# Patient Record
Sex: Female | Born: 1965
Health system: Southern US, Community
[De-identification: ages and names within clinical notes are randomized; demographics above are authoritative.]

## PROBLEM LIST (undated history)

## (undated) DIAGNOSIS — M545 Low back pain: Secondary | ICD-10-CM

## (undated) DIAGNOSIS — R634 Abnormal weight loss: Secondary | ICD-10-CM

## (undated) DIAGNOSIS — F419 Anxiety disorder, unspecified: Secondary | ICD-10-CM

## (undated) DIAGNOSIS — F329 Major depressive disorder, single episode, unspecified: Secondary | ICD-10-CM

## (undated) DIAGNOSIS — J069 Acute upper respiratory infection, unspecified: Secondary | ICD-10-CM

## (undated) DIAGNOSIS — K599 Functional intestinal disorder, unspecified: Secondary | ICD-10-CM

## (undated) DIAGNOSIS — J019 Acute sinusitis, unspecified: Secondary | ICD-10-CM

## (undated) DIAGNOSIS — F411 Generalized anxiety disorder: Secondary | ICD-10-CM

## (undated) DIAGNOSIS — R197 Diarrhea, unspecified: Secondary | ICD-10-CM

## (undated) DIAGNOSIS — R05 Cough: Secondary | ICD-10-CM

## (undated) DIAGNOSIS — F32A Depression, unspecified: Secondary | ICD-10-CM

## (undated) DIAGNOSIS — Z87898 Personal history of other specified conditions: Secondary | ICD-10-CM

## (undated) DIAGNOSIS — J45909 Unspecified asthma, uncomplicated: Secondary | ICD-10-CM

## (undated) DIAGNOSIS — R109 Unspecified abdominal pain: Secondary | ICD-10-CM

## (undated) DIAGNOSIS — K589 Irritable bowel syndrome without diarrhea: Secondary | ICD-10-CM

## (undated) DIAGNOSIS — R079 Chest pain, unspecified: Secondary | ICD-10-CM

## (undated) DIAGNOSIS — K219 Gastro-esophageal reflux disease without esophagitis: Secondary | ICD-10-CM

## (undated) HISTORY — DX: Low back pain: M54.5

## (undated) HISTORY — DX: Unspecified asthma, uncomplicated: J45.909

## (undated) HISTORY — DX: Functional intestinal disorder, unspecified: K59.9

## (undated) HISTORY — DX: Anxiety disorder, unspecified: F41.9

## (undated) HISTORY — DX: Irritable bowel syndrome without diarrhea: K58.9

## (undated) HISTORY — DX: Unspecified abdominal pain: R10.9

## (undated) HISTORY — DX: Generalized anxiety disorder: F41.1

## (undated) HISTORY — DX: Chest pain, unspecified: R07.9

## (undated) HISTORY — DX: Acute sinusitis, unspecified: J01.90

## (undated) HISTORY — DX: Diarrhea, unspecified: R19.7

## (undated) HISTORY — DX: Cough: R05

## (undated) HISTORY — DX: Acute upper respiratory infection, unspecified: J06.9

## (undated) HISTORY — PX: OTHER SURGICAL HISTORY: SHX169

## (undated) HISTORY — DX: Gastro-esophageal reflux disease without esophagitis: K21.9

## (undated) HISTORY — PX: NO PAST SURGERIES: SHX2092

## (undated) HISTORY — DX: Depression, unspecified: F32.A

## (undated) HISTORY — DX: Major depressive disorder, single episode, unspecified: F32.9

## (undated) HISTORY — DX: Personal history of other specified conditions: Z87.898

## (undated) HISTORY — DX: Abnormal weight loss: R63.4

---
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
## 1999-03-30 ENCOUNTER — Other Ambulatory Visit: Admission: RE | Admit: 1999-03-30 | Discharge: 1999-03-30 | Payer: Self-pay | Admitting: Gynecology

## 2000-07-11 ENCOUNTER — Encounter: Payer: Self-pay | Admitting: Emergency Medicine

## 2000-07-11 ENCOUNTER — Emergency Department (HOSPITAL_COMMUNITY): Admission: EM | Admit: 2000-07-11 | Discharge: 2000-07-11 | Payer: Self-pay | Admitting: Emergency Medicine

## 2000-12-18 ENCOUNTER — Other Ambulatory Visit: Admission: RE | Admit: 2000-12-18 | Discharge: 2000-12-18 | Payer: Self-pay | Admitting: Gynecology

## 2002-02-20 ENCOUNTER — Other Ambulatory Visit: Admission: RE | Admit: 2002-02-20 | Discharge: 2002-02-20 | Payer: Self-pay | Admitting: Gynecology

## 2003-03-16 ENCOUNTER — Other Ambulatory Visit: Admission: RE | Admit: 2003-03-16 | Discharge: 2003-03-16 | Payer: Self-pay | Admitting: Gynecology

## 2003-04-27 ENCOUNTER — Encounter: Admission: RE | Admit: 2003-04-27 | Discharge: 2003-04-27 | Payer: Self-pay | Admitting: Internal Medicine

## 2003-11-17 ENCOUNTER — Encounter: Admission: RE | Admit: 2003-11-17 | Discharge: 2003-11-17 | Payer: Self-pay | Admitting: Internal Medicine

## 2005-05-29 ENCOUNTER — Ambulatory Visit: Payer: Self-pay | Admitting: Internal Medicine

## 2006-09-24 ENCOUNTER — Ambulatory Visit: Payer: Self-pay | Admitting: Internal Medicine

## 2006-09-24 LAB — CONVERTED CEMR LAB
Albumin: 4.1 g/dL (ref 3.5–5.2)
BUN: 16 mg/dL (ref 6–23)
Basophils Relative: 5.5 % — ABNORMAL HIGH (ref 0.0–1.0)
CO2: 25 meq/L (ref 19–32)
Calcium: 9.4 mg/dL (ref 8.4–10.5)
Creatinine, Ser: 0.7 mg/dL (ref 0.4–1.2)
Eosinophils Relative: 4.7 % (ref 0.0–5.0)
GFR calc Af Amer: 120 mL/min
GFR calc non Af Amer: 100 mL/min
Glucose, Bld: 109 mg/dL — ABNORMAL HIGH (ref 70–99)
HCT: 41.9 % (ref 36.0–46.0)
Hemoglobin: 14.3 g/dL (ref 12.0–15.0)
MCHC: 34.2 g/dL (ref 30.0–36.0)
Monocytes Relative: 7.7 % (ref 3.0–11.0)
Neutrophils Relative %: 55.2 % (ref 43.0–77.0)
Potassium: 4.1 meq/L (ref 3.5–5.1)
Sodium: 139 meq/L (ref 135–145)
Total Bilirubin: 0.5 mg/dL (ref 0.3–1.2)
VLDL: 8 mg/dL (ref 0–40)
WBC: 7.7 10*3/uL (ref 4.5–10.5)

## 2006-10-02 ENCOUNTER — Ambulatory Visit: Payer: Self-pay | Admitting: Internal Medicine

## 2007-01-04 ENCOUNTER — Ambulatory Visit: Payer: Self-pay | Admitting: Internal Medicine

## 2007-01-04 LAB — CONVERTED CEMR LAB
Bilirubin Urine: NEGATIVE
Eosinophils Absolute: 0.4 10*3/uL (ref 0.0–0.6)
HCT: 39.6 % (ref 36.0–46.0)
Hemoglobin, Urine: NEGATIVE
Hemoglobin: 13.7 g/dL (ref 12.0–15.0)
Leukocytes, UA: NEGATIVE
MCHC: 34.6 g/dL (ref 30.0–36.0)
MCV: 96.3 fL (ref 78.0–100.0)
Monocytes Absolute: 1 10*3/uL — ABNORMAL HIGH (ref 0.2–0.7)
Monocytes Relative: 12.6 % — ABNORMAL HIGH (ref 3.0–11.0)
Neutro Abs: 3.9 10*3/uL (ref 1.4–7.7)
Platelets: 264 10*3/uL (ref 150–400)
RDW: 12.3 % (ref 11.5–14.6)
Specific Gravity, Urine: 1.025 (ref 1.000–1.03)
Urine Glucose: NEGATIVE mg/dL

## 2007-01-09 ENCOUNTER — Ambulatory Visit: Payer: Self-pay | Admitting: Internal Medicine

## 2007-01-09 DIAGNOSIS — J45909 Unspecified asthma, uncomplicated: Secondary | ICD-10-CM | POA: Insufficient documentation

## 2007-01-09 DIAGNOSIS — Z8669 Personal history of other diseases of the nervous system and sense organs: Secondary | ICD-10-CM

## 2007-01-09 DIAGNOSIS — Z87898 Personal history of other specified conditions: Secondary | ICD-10-CM

## 2007-01-09 HISTORY — DX: Personal history of other diseases of the nervous system and sense organs: Z86.69

## 2007-01-09 HISTORY — DX: Unspecified asthma, uncomplicated: J45.909

## 2007-01-09 HISTORY — DX: Personal history of other specified conditions: Z87.898

## 2007-03-04 ENCOUNTER — Ambulatory Visit: Payer: Self-pay | Admitting: Internal Medicine

## 2007-03-04 DIAGNOSIS — R634 Abnormal weight loss: Secondary | ICD-10-CM

## 2007-03-04 DIAGNOSIS — K599 Functional intestinal disorder, unspecified: Secondary | ICD-10-CM

## 2007-03-04 HISTORY — DX: Abnormal weight loss: R63.4

## 2007-03-04 HISTORY — DX: Functional intestinal disorder, unspecified: K59.9

## 2007-03-10 DIAGNOSIS — F411 Generalized anxiety disorder: Secondary | ICD-10-CM

## 2007-03-10 DIAGNOSIS — K219 Gastro-esophageal reflux disease without esophagitis: Secondary | ICD-10-CM

## 2007-03-10 HISTORY — DX: Gastro-esophageal reflux disease without esophagitis: K21.9

## 2007-03-10 HISTORY — DX: Generalized anxiety disorder: F41.1

## 2007-03-14 ENCOUNTER — Encounter: Payer: Self-pay | Admitting: Internal Medicine

## 2007-04-11 ENCOUNTER — Telehealth: Payer: Self-pay | Admitting: Internal Medicine

## 2007-05-07 ENCOUNTER — Telehealth: Payer: Self-pay | Admitting: Internal Medicine

## 2007-05-20 ENCOUNTER — Telehealth: Payer: Self-pay | Admitting: Internal Medicine

## 2007-06-05 ENCOUNTER — Telehealth (INDEPENDENT_AMBULATORY_CARE_PROVIDER_SITE_OTHER): Payer: Self-pay | Admitting: *Deleted

## 2007-06-11 ENCOUNTER — Ambulatory Visit: Payer: Self-pay | Admitting: Internal Medicine

## 2007-06-11 DIAGNOSIS — K589 Irritable bowel syndrome without diarrhea: Secondary | ICD-10-CM

## 2007-06-11 DIAGNOSIS — M545 Low back pain, unspecified: Secondary | ICD-10-CM

## 2007-06-11 HISTORY — DX: Irritable bowel syndrome, unspecified: K58.9

## 2007-06-11 HISTORY — DX: Low back pain, unspecified: M54.50

## 2007-07-02 ENCOUNTER — Telehealth (INDEPENDENT_AMBULATORY_CARE_PROVIDER_SITE_OTHER): Payer: Self-pay | Admitting: *Deleted

## 2007-07-03 ENCOUNTER — Ambulatory Visit: Payer: Self-pay | Admitting: Internal Medicine

## 2007-07-24 ENCOUNTER — Telehealth: Payer: Self-pay | Admitting: Internal Medicine

## 2007-08-01 ENCOUNTER — Telehealth: Payer: Self-pay | Admitting: Internal Medicine

## 2007-08-05 ENCOUNTER — Telehealth: Payer: Self-pay | Admitting: Internal Medicine

## 2007-08-09 ENCOUNTER — Ambulatory Visit: Payer: Self-pay | Admitting: Internal Medicine

## 2007-08-09 ENCOUNTER — Telehealth: Payer: Self-pay | Admitting: Internal Medicine

## 2007-08-09 DIAGNOSIS — R059 Cough, unspecified: Secondary | ICD-10-CM | POA: Insufficient documentation

## 2007-08-09 DIAGNOSIS — R05 Cough: Secondary | ICD-10-CM

## 2007-08-09 DIAGNOSIS — J069 Acute upper respiratory infection, unspecified: Secondary | ICD-10-CM

## 2007-08-09 HISTORY — DX: Acute upper respiratory infection, unspecified: J06.9

## 2007-08-09 HISTORY — DX: Cough, unspecified: R05.9

## 2007-08-23 ENCOUNTER — Telehealth: Payer: Self-pay | Admitting: Internal Medicine

## 2007-08-29 ENCOUNTER — Encounter: Payer: Self-pay | Admitting: Internal Medicine

## 2007-08-29 ENCOUNTER — Telehealth: Payer: Self-pay | Admitting: Internal Medicine

## 2007-09-03 ENCOUNTER — Ambulatory Visit: Payer: Self-pay | Admitting: Internal Medicine

## 2007-09-03 ENCOUNTER — Encounter: Payer: Self-pay | Admitting: Internal Medicine

## 2007-09-03 ENCOUNTER — Telehealth: Payer: Self-pay | Admitting: Internal Medicine

## 2007-09-03 DIAGNOSIS — R079 Chest pain, unspecified: Secondary | ICD-10-CM | POA: Insufficient documentation

## 2007-09-03 HISTORY — DX: Chest pain, unspecified: R07.9

## 2007-09-10 ENCOUNTER — Telehealth: Payer: Self-pay | Admitting: Internal Medicine

## 2007-09-19 ENCOUNTER — Telehealth: Payer: Self-pay | Admitting: Internal Medicine

## 2007-10-01 ENCOUNTER — Telehealth: Payer: Self-pay | Admitting: Internal Medicine

## 2007-10-02 ENCOUNTER — Ambulatory Visit: Payer: Self-pay | Admitting: Internal Medicine

## 2007-10-02 DIAGNOSIS — J019 Acute sinusitis, unspecified: Secondary | ICD-10-CM

## 2007-10-02 HISTORY — DX: Acute sinusitis, unspecified: J01.90

## 2007-10-03 ENCOUNTER — Telehealth: Payer: Self-pay | Admitting: Internal Medicine

## 2007-10-04 ENCOUNTER — Encounter: Payer: Self-pay | Admitting: Internal Medicine

## 2007-10-10 ENCOUNTER — Telehealth: Payer: Self-pay | Admitting: Internal Medicine

## 2007-10-18 ENCOUNTER — Telehealth: Payer: Self-pay | Admitting: Internal Medicine

## 2007-11-14 ENCOUNTER — Telehealth: Payer: Self-pay | Admitting: Internal Medicine

## 2007-11-18 ENCOUNTER — Telehealth: Payer: Self-pay | Admitting: Internal Medicine

## 2007-11-22 ENCOUNTER — Telehealth (INDEPENDENT_AMBULATORY_CARE_PROVIDER_SITE_OTHER): Payer: Self-pay | Admitting: *Deleted

## 2007-12-09 ENCOUNTER — Telehealth (INDEPENDENT_AMBULATORY_CARE_PROVIDER_SITE_OTHER): Payer: Self-pay | Admitting: *Deleted

## 2007-12-17 ENCOUNTER — Encounter: Payer: Self-pay | Admitting: Internal Medicine

## 2007-12-19 ENCOUNTER — Encounter: Payer: Self-pay | Admitting: Internal Medicine

## 2007-12-27 ENCOUNTER — Telehealth: Payer: Self-pay | Admitting: Internal Medicine

## 2008-01-10 ENCOUNTER — Telehealth: Payer: Self-pay | Admitting: Internal Medicine

## 2008-02-06 ENCOUNTER — Telehealth: Payer: Self-pay | Admitting: Internal Medicine

## 2008-02-26 ENCOUNTER — Telehealth: Payer: Self-pay | Admitting: Internal Medicine

## 2008-02-28 ENCOUNTER — Telehealth: Payer: Self-pay | Admitting: Internal Medicine

## 2008-03-06 ENCOUNTER — Telehealth: Payer: Self-pay | Admitting: Internal Medicine

## 2008-03-09 ENCOUNTER — Ambulatory Visit: Payer: Self-pay | Admitting: Internal Medicine

## 2008-03-09 DIAGNOSIS — R197 Diarrhea, unspecified: Secondary | ICD-10-CM

## 2008-03-09 DIAGNOSIS — R109 Unspecified abdominal pain: Secondary | ICD-10-CM

## 2008-03-09 HISTORY — DX: Diarrhea, unspecified: R19.7

## 2008-03-09 HISTORY — DX: Unspecified abdominal pain: R10.9

## 2008-03-17 ENCOUNTER — Telehealth: Payer: Self-pay | Admitting: Internal Medicine

## 2008-03-24 ENCOUNTER — Telehealth: Payer: Self-pay | Admitting: Internal Medicine

## 2008-03-27 ENCOUNTER — Telehealth: Payer: Self-pay | Admitting: Internal Medicine

## 2008-04-02 ENCOUNTER — Telehealth: Payer: Self-pay | Admitting: Internal Medicine

## 2008-04-03 ENCOUNTER — Telehealth: Payer: Self-pay | Admitting: Internal Medicine

## 2008-04-21 ENCOUNTER — Telehealth: Payer: Self-pay | Admitting: Internal Medicine

## 2008-04-22 ENCOUNTER — Telehealth: Payer: Self-pay | Admitting: Internal Medicine

## 2008-04-23 ENCOUNTER — Telehealth: Payer: Self-pay | Admitting: Internal Medicine

## 2008-04-27 ENCOUNTER — Encounter: Payer: Self-pay | Admitting: Internal Medicine

## 2008-05-06 ENCOUNTER — Telehealth (INDEPENDENT_AMBULATORY_CARE_PROVIDER_SITE_OTHER): Payer: Self-pay | Admitting: *Deleted

## 2008-05-15 ENCOUNTER — Telehealth (INDEPENDENT_AMBULATORY_CARE_PROVIDER_SITE_OTHER): Payer: Self-pay | Admitting: *Deleted

## 2008-09-29 IMAGING — CR DG CHEST 2V
2 series · 2 of 2 positions shown · non-contrast
Comparison: 05/29/2005

CLINICAL DATA: Mid chest pain. Previous smoker. Asthma. 
 CHEST ? 2 VIEW:

[view not recorded (1 of 2)]
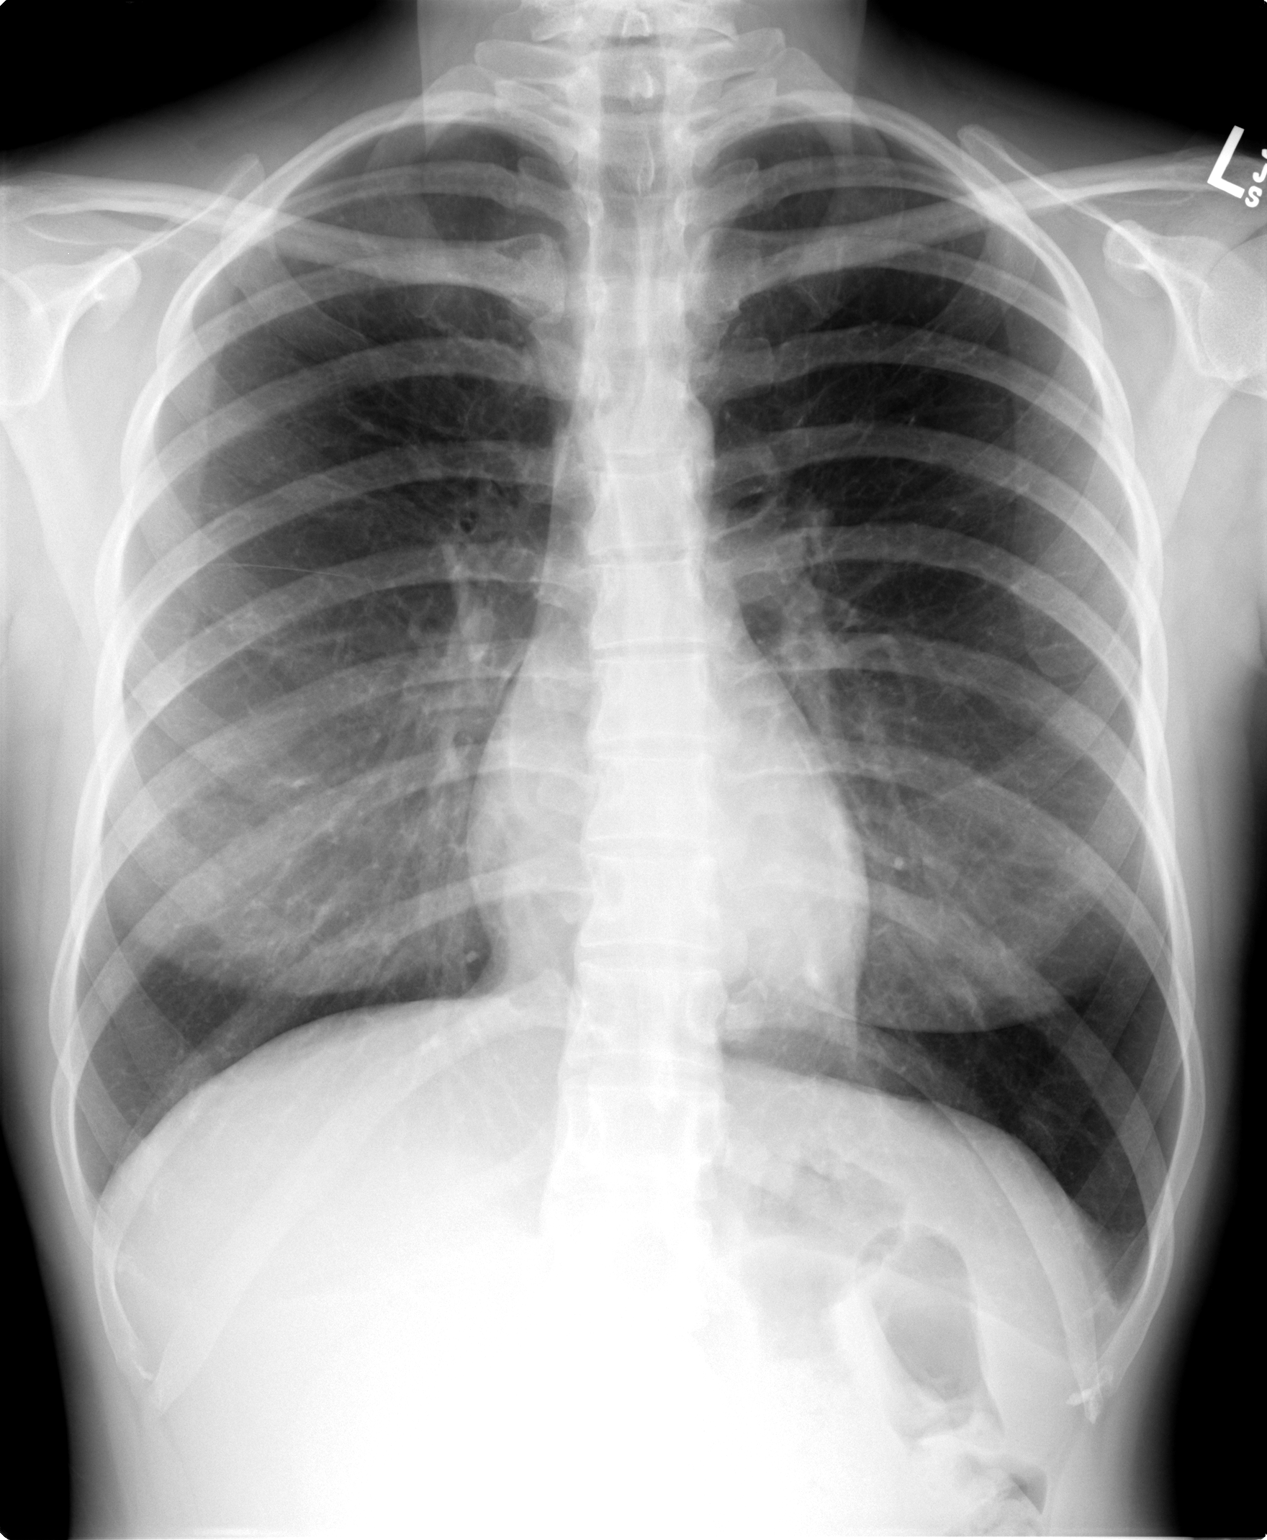

[view not recorded (2 of 2)]
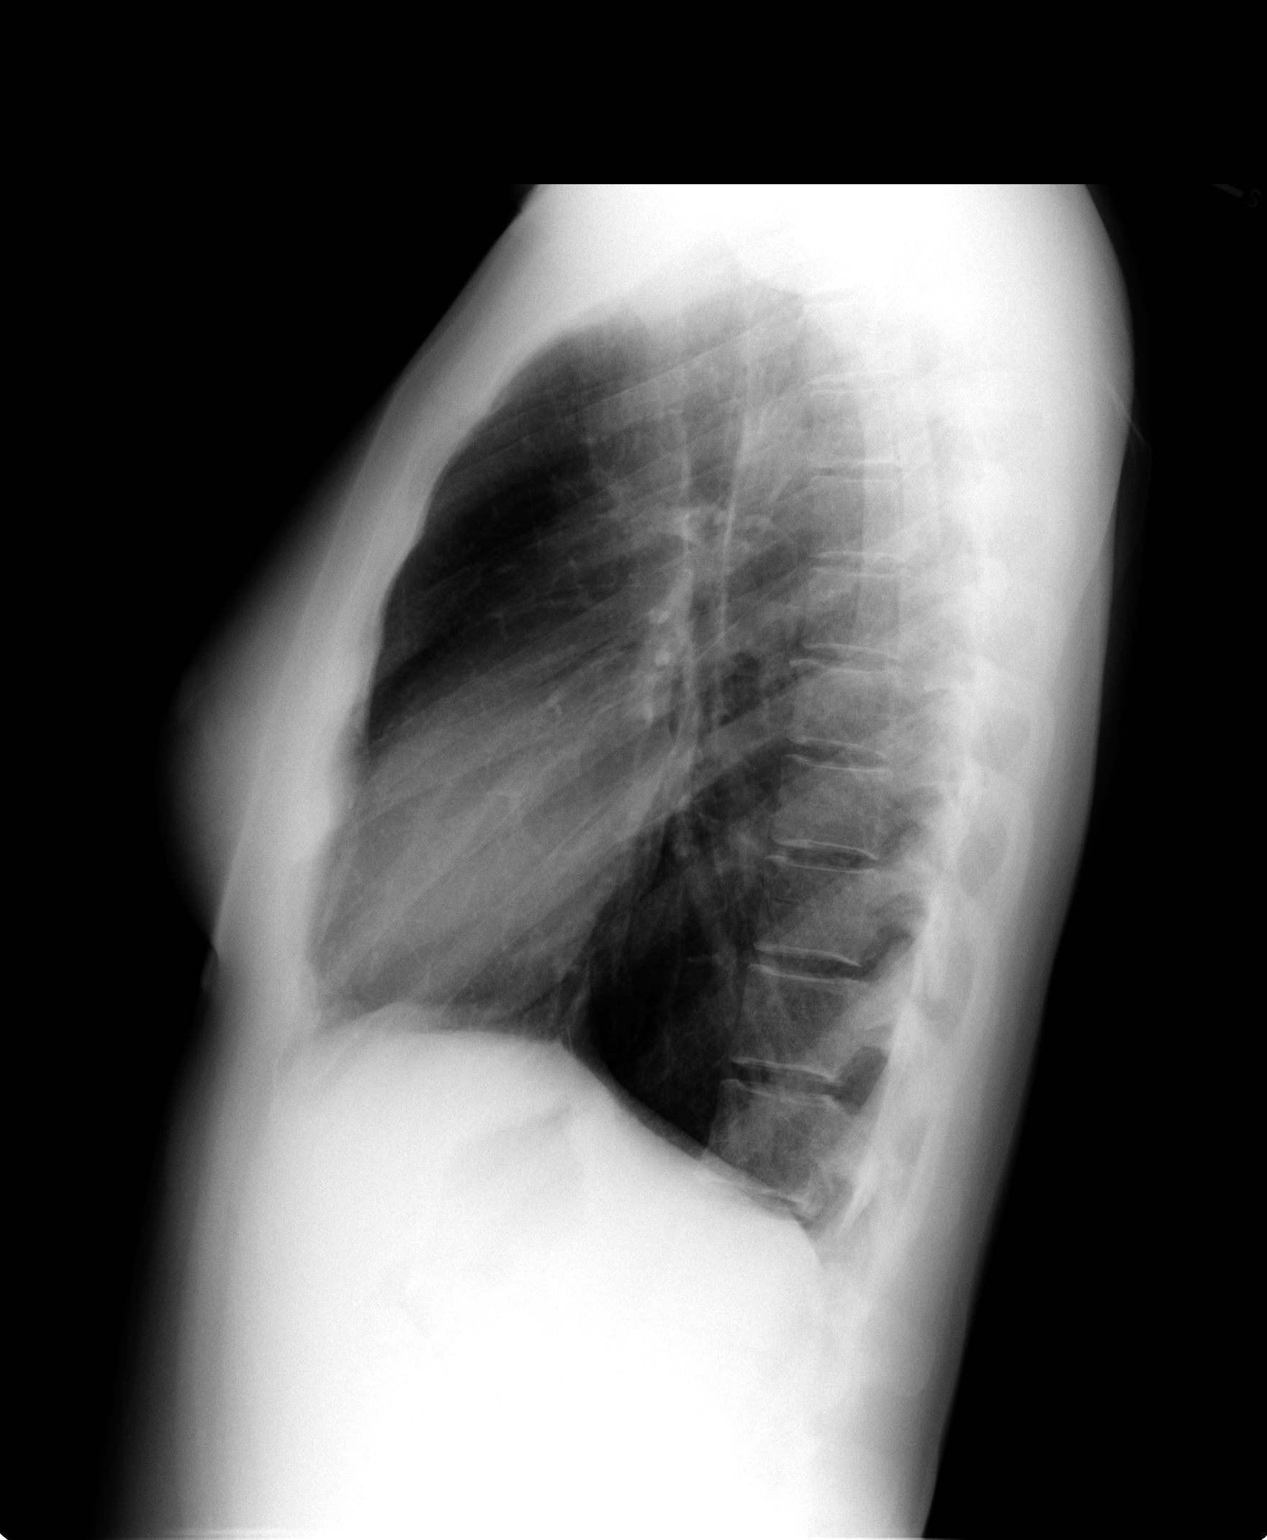

[2 of 2 positions shown; findings below may reference images not displayed]

FINDINGS: Normal cardiac size and shape.  Moderate hyperaeration of the lungs and minimal central bronchial wall thickening.  Findings are compatible with COPD/emphysema and minimal chronic bronchitic changes.  No interval change is appreciated.
IMPRESSION: No acute chest findings.  Findings suspicious for COPD/emphysema.

## 2010-07-09 ENCOUNTER — Encounter: Payer: Self-pay | Admitting: Internal Medicine

## 2010-11-04 NOTE — Assessment & Plan Note (Signed)
[facility]                                 ON-CALL NOTE   NAME:Bass, Judith BADDERS                    MRN:          [id number]  DATE:10/28/2006                            DOB:          04/08/1966    TIME OF CALL:  12:24pm   The patient states that she was having migraine headaches and she has  refills on her Maxalt, but her pharmacy is closed today and she has been  unable to pick it. Maxalt, 10 mg, number of 8 tablets was called into  [facility] on [address] and the patient will followed up with Dr.  Posey Rea as needed.     Lelon Perla, DO  Electronically Signed    Shawnie Dapper  DD: 10/28/2006  DT: 10/29/2006  Job #: [id number]   cc:   Georgina Quint. Plotnikov, MD

## 2016-02-11 DIAGNOSIS — M797 Fibromyalgia: Secondary | ICD-10-CM | POA: Insufficient documentation

## 2016-02-11 HISTORY — DX: Fibromyalgia: M79.7

## 2017-07-02 DIAGNOSIS — E7800 Pure hypercholesterolemia, unspecified: Secondary | ICD-10-CM

## 2017-07-02 HISTORY — DX: Pure hypercholesterolemia, unspecified: E78.00

## 2018-07-23 ENCOUNTER — Encounter: Payer: Self-pay | Admitting: Nurse Practitioner

## 2018-07-23 ENCOUNTER — Ambulatory Visit: Payer: Self-pay | Attending: Nurse Practitioner | Admitting: Nurse Practitioner

## 2018-07-23 VITALS — BP 152/88 | HR 110 | Temp 98.3°F | Ht 64.0 in | Wt 184.4 lb

## 2018-07-23 DIAGNOSIS — F32A Depression, unspecified: Secondary | ICD-10-CM

## 2018-07-23 DIAGNOSIS — F329 Major depressive disorder, single episode, unspecified: Secondary | ICD-10-CM

## 2018-07-23 DIAGNOSIS — K219 Gastro-esophageal reflux disease without esophagitis: Secondary | ICD-10-CM

## 2018-07-23 DIAGNOSIS — F419 Anxiety disorder, unspecified: Secondary | ICD-10-CM

## 2018-07-23 DIAGNOSIS — M797 Fibromyalgia: Secondary | ICD-10-CM

## 2018-07-23 DIAGNOSIS — R3915 Urgency of urination: Secondary | ICD-10-CM

## 2018-07-23 LAB — POCT URINALYSIS DIP (CLINITEK)
BILIRUBIN UA: NEGATIVE
BILIRUBIN UA: NEGATIVE mg/dL
Blood, UA: NEGATIVE
Glucose, UA: NEGATIVE mg/dL
Leukocytes, UA: NEGATIVE
Nitrite, UA: NEGATIVE
PH UA: 6 (ref 5.0–8.0)
POC PROTEIN,UA: NEGATIVE
Spec Grav, UA: 1.01 (ref 1.010–1.025)
Urobilinogen, UA: 0.2 E.U./dL

## 2018-07-23 MED ORDER — AMITRIPTYLINE HCL 25 MG PO TABS: 25.0000 mg | ORAL_TABLET | Freq: Every day | ORAL | 0 refills | Status: DC

## 2018-07-23 MED ORDER — HYDROXYZINE HCL 50 MG PO TABS: 50.0000 mg | ORAL_TABLET | Freq: Three times a day (TID) | ORAL | 2 refills | Status: DC | PRN

## 2018-07-23 MED ORDER — OMEPRAZOLE 20 MG PO CPDR: 20.0000 mg | DELAYED_RELEASE_CAPSULE | Freq: Every day | ORAL | 3 refills | Status: DC

## 2018-07-23 MED FILL — AMITRIPTYLINE HCL 25 MG TAB: 25 | 30 days supply | Qty: 30 | Fill #0

## 2018-07-23 MED FILL — OMEPRAZOLE 20 MG CAP: 20 | 30 days supply | Qty: 30 | Fill #0

## 2018-07-23 MED FILL — hydrOXYzine HCL 50 MG TABS: 50 | 20 days supply | Qty: 60 | Fill #0

## 2018-07-23 NOTE — Progress Notes (Signed)
 Assessment & Plan:  Judith Bass was seen today for new patient (initial visit).  Diagnoses and all orders for this visit:  Anxiety and depression -     amitriptyline (ELAVIL) 25 MG tablet; Take 1 tablet (25 mg total) by mouth at bedtime. -     hydrOXYzine (ATARAX/VISTARIL) 50 MG tablet; Take 1 tablet (50 mg total) by mouth 3 (three) times daily as needed. -     CBC -     CMP14+EGFR -     Lipid panel -     TSH States Buspar caused memory loss. Will try hydroxyzine.  She denies any current thoughts of self harm.   Fibromyalgia Needs Neurology referral. Patient has been advised to apply for financial assistance and schedule to see our financial counselor.     Urinary  urgency, unspecified type -     POCT URINALYSIS DIP (CLINITEK) Onset several months ago. She denies any symptoms of vaginitis or any other GU symptoms, bilateral flank pain, hematuria of pelvic pressure.   Gastroesophageal reflux disease, esophagitis presence not specified -     omeprazole (PRILOSEC) 20 MG capsule; Take 1 capsule (20 mg total) by mouth daily. INSTRUCTIONS: Avoid GERD Triggers: acidic, spicy or fried foods, caffeine, coffee, sodas,  alcohol and chocolate.     Patient has been counseled on age-appropriate routine health concerns for screening and prevention. These are reviewed and up-to-date. Referrals have been placed accordingly. Immunizations are up-to-date or declined.    Subjective:   Chief Complaint  Patient presents with  . New Patient (Initial Visit)    Patient is here to establish for ibromyalgia.    HPI Judith Bass 52 y.o. female presents to office today to establish care.She takes provigil for energy. She has a chronic history of depression. She left her job last feb due to pain, fatigue and depression.  She lives in [place] and reports a history of  PTSD, bipolar disorder anxiety, depression and fibromyalgia. States she is filing for disability. She was treated for her  fibromyalgia in the past by neurology. She will need to be referred once she has obtained financial assistance as I have instructed her that I do not treat fibromyalgia. She endorses pain in her neck, shoulders, hips, back and right arm. Right arm pain onset a few months ago. She has migraines from her neck and shoulder pain. Imitrex sometimes provides relief. Other symptoms include urinary urgency which she states started with the fibromyalgia.   GERD She endorses a history of stress ulcers. Omeprazole provides relief of her abdominal and epigastric pain.  She denies bilious reflux, choking on food, deep pressure at base of neck, difficulty swallowing, hematemesis, nausea, regurgitation of undigested food and unexpected weight loss.  She denies dysphagia.  She has not lost weight. She denies melena, hematochezia, hematemesis, and coffee ground emesis. Medical therapy in the past has included proton pump inhibitors.   Review of Systems  Constitutional: Negative for fever, malaise/fatigue and weight loss.  HENT: Negative.  Negative for nosebleeds.   Eyes: Negative.  Negative for blurred vision, double vision and photophobia.  Respiratory: Negative.  Negative for cough and shortness of breath.   Cardiovascular: Negative.  Negative for chest pain, palpitations and leg swelling.  Gastrointestinal: Positive for heartburn. Negative for nausea and vomiting.  Genitourinary: Positive for urgency. Negative for dysuria, flank pain, frequency and hematuria.  Musculoskeletal: Positive for joint pain, myalgias and neck pain.  Neurological: Negative.  Negative for dizziness, focal weakness, seizures and headaches.    Psychiatric/Behavioral: Positive for depression. Negative for suicidal ideas. The patient is nervous/anxious.     Past Medical History:  Diagnosis Date  . Anxiety   . Depression     History reviewed. No pertinent surgical history.  Family History  Problem Relation Age of Onset  . Stroke  Sister   . Stroke Maternal Grandmother   . Stroke Maternal Grandfather     Social History Reviewed with no changes to be made today.   Outpatient Medications Prior to Visit  Medication Sig Dispense Refill  . albuterol (PROVENTIL HFA;VENTOLIN HFA) 108 (90 Base) MCG/ACT inhaler Inhale into the lungs every 6 (six) hours as needed for wheezing or shortness of breath.    . FLUoxetine (PROZAC) 20 MG capsule Take 20 mg by mouth daily.    . LORazepam (ATIVAN) 1 MG tablet Take 1 mg by mouth every 8 (eight) hours.    . modafinil (PROVIGIL) 200 MG tablet Take 200 mg by mouth every morning.    . pramipexole (MIRAPEX) 0.125 MG tablet Take 0.125 mg by mouth 2 times daily at 12 noon and 4 pm.    . QUETIAPINE FUMARATE PO Take 100 mg by mouth.    . SUMAtriptan (IMITREX) 100 MG tablet Take 100 mg by mouth every 2 (two) hours as needed for migraine. May repeat in 2 hours if headache persists or recurs.    . topiramate (TOPAMAX) 25 MG capsule Take 25 mg by mouth 3 (three) times daily as needed.    . amitriptyline (ELAVIL) 10 MG tablet Take 10 mg by mouth at bedtime.    . busPIRone (BUSPAR) 15 MG tablet Take 15 mg by mouth once as needed.    . gabapentin (NEURONTIN) 300 MG capsule Take 300 mg by mouth 3 (three) times daily.     No facility-administered medications prior to visit.     Not on File     Objective:    BP (!) 152/88 (BP Location: Left Arm, Patient Position: Sitting, Cuff Size: Normal)   Pulse (!) 110   Temp 98.3 F (36.8 C) (Oral)   Ht 5' 4" (1.626 m)   Wt 184 lb 6.4 oz (83.6 kg)   SpO2 96%   BMI 31.65 kg/m  Wt Readings from Last 3 Encounters:  07/23/18 184 lb 6.4 oz (83.6 kg)  03/04/07 123 lb (55.8 kg)    Physical Exam Vitals signs and nursing note reviewed.  Constitutional:      Appearance: She is well-developed.  HENT:     Head: Normocephalic and atraumatic.  Neck:     Musculoskeletal: Normal range of motion.  Cardiovascular:     Rate and Rhythm: Regular rhythm.  Tachycardia present.     Heart sounds: Normal heart sounds. No murmur. No friction rub. No gallop.   Pulmonary:     Effort: Pulmonary effort is normal. No tachypnea or respiratory distress.     Breath sounds: Normal breath sounds. No decreased breath sounds, wheezing, rhonchi or rales.  Chest:     Chest wall: No tenderness.  Abdominal:     General: Bowel sounds are normal.     Palpations: Abdomen is soft.  Musculoskeletal: Normal range of motion.  Skin:    General: Skin is warm and dry.  Neurological:     Mental Status: She is alert and oriented to person, place, and time.     Coordination: Coordination normal.  Psychiatric:        Attention and Perception: Attention normal.          Mood and Affect: Mood normal.        Speech: Speech normal.        Behavior: Behavior normal. Behavior is cooperative.        Thought Content: Thought content normal.        Cognition and Memory: Cognition normal.        Judgment: Judgment normal.        Patient has been counseled extensively about nutrition and exercise as well as the importance of adherence with medications and regular follow-up. The patient was given clear instructions to go to ER or return to medical center if symptoms don't improve, worsen or new problems develop. The patient verbalized understanding.   Follow-up: Return in about 3 weeks (around 08/13/2018) for follow up anxiety (atarax)   Zelda W Fleming, FNP-BC [facility] [place], [place] [phone]   07/25/2018, 1:08 PM 

## 2018-07-23 NOTE — Telephone Encounter (Signed)
My chart advice

## 2018-07-24 ENCOUNTER — Encounter: Payer: Self-pay | Admitting: Nurse Practitioner

## 2018-07-24 LAB — CMP14+EGFR
ALT: 25 IU/L (ref 0–32)
AST: 24 IU/L (ref 0–40)
Albumin/Globulin Ratio: 1.7 (ref 1.2–2.2)
Albumin: 4.4 g/dL (ref 3.8–4.8)
Alkaline Phosphatase: 101 IU/L (ref 39–117)
BUN/Creatinine Ratio: 16 (ref 9–23)
BUN: 14 mg/dL (ref 6–24)
Bilirubin Total: 0.4 mg/dL (ref 0.0–1.2)
CO2: 19 mmol/L — ABNORMAL LOW (ref 20–29)
Calcium: 9.4 mg/dL (ref 8.7–10.2)
Chloride: 103 mmol/L (ref 96–106)
Creatinine, Ser: 0.9 mg/dL (ref 0.57–1.00)
GFR calc Af Amer: 86 mL/min/1.73
GFR calc non Af Amer: 75 mL/min/1.73
Globulin, Total: 2.6 g/dL (ref 1.5–4.5)
Glucose: 92 mg/dL (ref 65–99)
Potassium: 4.4 mmol/L (ref 3.5–5.2)
Sodium: 138 mmol/L (ref 134–144)
Total Protein: 7 g/dL (ref 6.0–8.5)

## 2018-07-24 LAB — LIPID PANEL
CHOLESTEROL TOTAL: 264 mg/dL — AB (ref 100–199)
Chol/HDL Ratio: 5.5 ratio — ABNORMAL HIGH (ref 0.0–4.4)
HDL: 48 mg/dL (ref >39)
LDL Calculated: 174 mg/dL — ABNORMAL HIGH (ref 0–99)
TRIGLYCERIDES: 211 mg/dL — AB (ref 0–149)
VLDL Cholesterol Cal: 42 mg/dL — ABNORMAL HIGH (ref 5–40)

## 2018-07-24 LAB — CBC
HEMATOCRIT: 39.8 % (ref 34.0–46.6)
HEMOGLOBIN: 14.1 g/dL (ref 11.1–15.9)
MCH: 32.8 pg (ref 26.6–33.0)
MCHC: 35.4 g/dL (ref 31.5–35.7)
MCV: 93 fL (ref 79–97)
Platelets: 314 10*3/uL (ref 150–450)
RBC: 4.3 x10E6/uL (ref 3.77–5.28)
RDW: 12.5 % (ref 11.7–15.4)
WBC: 6.9 10*3/uL (ref 3.4–10.8)

## 2018-07-24 LAB — TSH: TSH: 1.46 u[IU]/mL (ref 0.450–4.500)

## 2018-07-25 ENCOUNTER — Encounter: Payer: Self-pay | Admitting: Nurse Practitioner

## 2018-07-28 ENCOUNTER — Encounter: Payer: Self-pay | Admitting: Nurse Practitioner

## 2018-08-01 ENCOUNTER — Other Ambulatory Visit: Payer: Self-pay | Admitting: Nurse Practitioner

## 2018-08-01 MED ORDER — CYCLOBENZAPRINE HCL 5 MG PO TABS: 5.0000 mg | ORAL_TABLET | Freq: Three times a day (TID) | ORAL | 1 refills | Status: DC | PRN

## 2018-08-01 MED ORDER — DULOXETINE HCL 30 MG PO CPEP: 30.0000 mg | ORAL_CAPSULE | Freq: Every day | ORAL | 0 refills | Status: DC

## 2018-08-04 ENCOUNTER — Encounter: Payer: Self-pay | Admitting: Nurse Practitioner

## 2018-08-06 ENCOUNTER — Encounter: Payer: Self-pay | Admitting: Nurse Practitioner

## 2018-08-07 ENCOUNTER — Other Ambulatory Visit: Payer: Self-pay | Admitting: Nurse Practitioner

## 2018-08-07 MED ORDER — PROPRANOLOL HCL 20 MG PO TABS: 20.0000 mg | ORAL_TABLET | Freq: Two times a day (BID) | ORAL | 0 refills | Status: DC

## 2018-08-19 ENCOUNTER — Ambulatory Visit: Payer: Medicaid Other

## 2018-08-20 ENCOUNTER — Ambulatory Visit: Payer: Medicaid Other | Admitting: Nurse Practitioner

## 2018-08-20 ENCOUNTER — Other Ambulatory Visit: Payer: Self-pay | Admitting: Nurse Practitioner

## 2018-09-13 ENCOUNTER — Encounter: Payer: Self-pay | Admitting: Nurse Practitioner

## 2018-09-13 ENCOUNTER — Other Ambulatory Visit: Payer: Self-pay | Admitting: Nurse Practitioner

## 2018-09-13 DIAGNOSIS — F32A Depression, unspecified: Secondary | ICD-10-CM

## 2018-09-13 DIAGNOSIS — F419 Anxiety disorder, unspecified: Secondary | ICD-10-CM

## 2018-09-13 DIAGNOSIS — F329 Major depressive disorder, single episode, unspecified: Secondary | ICD-10-CM

## 2018-09-13 DIAGNOSIS — K219 Gastro-esophageal reflux disease without esophagitis: Secondary | ICD-10-CM

## 2018-09-16 ENCOUNTER — Encounter: Payer: Self-pay | Admitting: Nurse Practitioner

## 2018-09-16 NOTE — Telephone Encounter (Signed)
Refill Request.  

## 2018-09-17 MED ORDER — HYDROXYZINE HCL 50 MG PO TABS: 50.0000 mg | ORAL_TABLET | Freq: Three times a day (TID) | ORAL | 2 refills | Status: DC | PRN

## 2018-09-17 MED ORDER — OMEPRAZOLE 20 MG PO CPDR: 20.0000 mg | DELAYED_RELEASE_CAPSULE | Freq: Every day | ORAL | 2 refills | Status: DC

## 2018-09-17 NOTE — Telephone Encounter (Signed)
Covering provider refill request.

## 2018-10-26 ENCOUNTER — Other Ambulatory Visit: Payer: Self-pay | Admitting: Nurse Practitioner

## 2018-11-01 ENCOUNTER — Telehealth: Payer: Self-pay | Admitting: Cardiology

## 2018-11-01 NOTE — Telephone Encounter (Signed)
Virtual Visit Pre-Appointment Phone Call  "(Name), I am calling you today to discuss your upcoming appointment. We are currently trying to limit exposure to the virus that causes COVID-19 by seeing patients at home rather than in the office."  1. "What is the BEST phone number to call the day of the visit?" - include this in appointment notes  2. Do you have or have access to (through a family member/friend) a smartphone with video capability that we can use for your visit?" a. If yes - list this number in appt notes as cell (if different from BEST phone #) and list the appointment type as a VIDEO visit in appointment notes b. If no - list the appointment type as a PHONE visit in appointment notes  3. Confirm consent - "In the setting of the current Covid19 crisis, you are scheduled for a (phone or video) visit with your provider on (date) at (time).  Just as we do with many in-office visits, in order for you to participate in this visit, we must obtain consent.  If you'd like, I can send this to your mychart (if signed up) or email for you to review.  Otherwise, I can obtain your verbal consent now.  All virtual visits are billed to your insurance company just like a normal visit would be.  By agreeing to a virtual visit, we'd like you to understand that the technology does not allow for your provider to perform an examination, and thus may limit your provider's ability to fully assess your condition. If your provider identifies any concerns that need to be evaluated in person, we will make arrangements to do so.  Finally, though the technology is pretty good, we cannot assure that it will always work on either your or our end, and in the setting of a video visit, we may have to convert it to a phone-only visit.  In either situation, we cannot ensure that we have a secure connection.  Are you willing to proceed?" STAFF: Did the patient verbally acknowledge consent to telehealth visit? Document  YES/NO here: YES  4. Advise patient to be prepared - "Two hours prior to your appointment, go ahead and check your blood pressure, pulse, oxygen saturation, and your weight (if you have the equipment to check those) and write them all down. When your visit starts, your provider will ask you for this information. If you have an Apple Watch or Kardia device, please plan to have heart rate information ready on the day of your appointment. Please have a pen and paper handy nearby the day of the visit as well."  5. Give patient instructions for MyChart download to smartphone OR [email or web address] as below if video visit (depending on what platform provider is using)  6. Inform patient they will receive a phone call 15 minutes prior to their appointment time (may be from unknown caller ID) so they should be prepared to answer    TELEPHONE CALL NOTE  Judith SicksKimberly J Bass has been deemed a candidate for a follow-up tele-health visit to limit community exposure during the Covid-19 pandemic. I spoke with the patient via phone to ensure availability of phone/video source, confirm preferred email & phone number, and discuss instructions and expectations.  I reminded Judith Bass to be prepared with any vital sign and/or heart rhythm information that could potentially be obtained via home monitoring, at the time of her visit. I reminded Judith Bass to expect a phone call prior to  her visit.  Minette Brine 11/01/2018 2:47 PM   INSTRUCTIONS FOR DOWNLOADING THE MYCHART APP TO SMARTPHONE  - The patient must first make sure to have activated MyChart and know their login information - If Apple, go to [employer] and type in MyChart in the search bar and download the app. If Android, ask patient to go to [employer] and type in [place] in the search bar and download the app. The app is free but as with any other app downloads, their phone may require them to verify saved payment information or  Apple/Android password.  - The patient will need to then log into the app with their MyChart username and password, and select [facility] as their healthcare provider to link the account. When it is time for your visit, go to the MyChart app, find appointments, and click Begin Video Visit. Be sure to Select Allow for your device to access the Microphone and Camera for your visit. You will then be connected, and your provider will be with you shortly.  **If they have any issues connecting, or need assistance please contact MyChart service desk [phone])**  **If using a computer, in order to ensure the best quality for their visit they will need to use either of the following Internet Browsers: [employer] or Google Chrome**  IF USING DOXIMITY or DOXY.ME - The patient will receive a link just prior to their visit by text.     FULL LENGTH CONSENT FOR TELE-HEALTH VISIT   I hereby voluntarily request, consent and authorize [facility] and its employed or contracted physicians, physician assistants, nurse practitioners or other licensed health care professionals (the Practitioner), to provide me with telemedicine health care services (the Services") as deemed necessary by the treating Practitioner. I acknowledge and consent to receive the Services by the Practitioner via telemedicine. I understand that the telemedicine visit will involve communicating with the Practitioner through live audiovisual communication technology and the disclosure of certain medical information by electronic transmission. I acknowledge that I have been given the opportunity to request an in-person assessment or other available alternative prior to the telemedicine visit and am voluntarily participating in the telemedicine visit.  I understand that I have the right to withhold or withdraw my consent to the use of telemedicine in the course of my care at any time, without affecting my right to future care  or treatment, and that the Practitioner or I may terminate the telemedicine visit at any time. I understand that I have the right to inspect all information obtained and/or recorded in the course of the telemedicine visit and may receive copies of available information for a reasonable fee.  I understand that some of the potential risks of receiving the Services via telemedicine include:   Delay or interruption in medical evaluation due to technological equipment failure or disruption;  Information transmitted may not be sufficient (e.g. poor resolution of images) to allow for appropriate medical decision making by the Practitioner; and/or   In rare instances, security protocols could fail, causing a breach of personal health information.  Furthermore, I acknowledge that it is my responsibility to provide information about my medical history, conditions and care that is complete and accurate to the best of my ability. I acknowledge that Practitioner's advice, recommendations, and/or decision may be based on factors not within their control, such as incomplete or inaccurate data provided by me or distortions of diagnostic images or specimens that may result from electronic transmissions. I  understand that the practice of medicine is not an exact science and that Practitioner makes no warranties or guarantees regarding treatment outcomes. I acknowledge that I will receive a copy of this consent concurrently upon execution via email to the email address I last provided but may also request a printed copy by calling the office of [place].    I understand that my insurance will be billed for this visit.   I have read or had this consent read to me.  I understand the contents of this consent, which adequately explains the benefits and risks of the Services being provided via telemedicine.   I have been provided ample opportunity to ask questions regarding this consent and the Services and have had  my questions answered to my satisfaction.  I give my informed consent for the services to be provided through the use of telemedicine in my medical care  By participating in this telemedicine visit I agree to the above.

## 2018-11-05 ENCOUNTER — Other Ambulatory Visit: Payer: Self-pay | Admitting: *Deleted

## 2018-11-05 ENCOUNTER — Encounter: Payer: Self-pay | Admitting: *Deleted

## 2018-11-06 ENCOUNTER — Encounter: Payer: Self-pay | Admitting: Cardiology

## 2018-11-06 ENCOUNTER — Telehealth (INDEPENDENT_AMBULATORY_CARE_PROVIDER_SITE_OTHER): Payer: Medicaid Other | Admitting: Cardiology

## 2018-11-06 ENCOUNTER — Other Ambulatory Visit: Payer: Self-pay

## 2018-11-06 VITALS — BP 143/72 | HR 95 | Ht 64.0 in | Wt 176.0 lb

## 2018-11-06 DIAGNOSIS — F17210 Nicotine dependence, cigarettes, uncomplicated: Secondary | ICD-10-CM

## 2018-11-06 DIAGNOSIS — R002 Palpitations: Secondary | ICD-10-CM

## 2018-11-06 DIAGNOSIS — R0789 Other chest pain: Secondary | ICD-10-CM

## 2018-11-06 DIAGNOSIS — E782 Mixed hyperlipidemia: Secondary | ICD-10-CM | POA: Insufficient documentation

## 2018-11-06 DIAGNOSIS — R079 Chest pain, unspecified: Secondary | ICD-10-CM

## 2018-11-06 HISTORY — DX: Mixed hyperlipidemia: E78.2

## 2018-11-06 HISTORY — DX: Other chest pain: R07.89

## 2018-11-06 HISTORY — DX: Palpitations: R00.2

## 2018-11-06 MED ORDER — NITROGLYCERIN 0.4 MG SL SUBL: 0.4000 mg | SUBLINGUAL_TABLET | SUBLINGUAL | 3 refills | Status: DC | PRN

## 2018-11-06 NOTE — Progress Notes (Signed)
Virtual Visit via Video Note   This visit type was conducted due to national recommendations for restrictions regarding the COVID-19 Pandemic (e.g. social distancing) in an effort to limit this patient's exposure and mitigate transmission in our community.  Due to her co-morbid illnesses, this patient is at least at moderate risk for complications without adequate follow up.  This format is felt to be most appropriate for this patient at this time.  All issues noted in this document were discussed and addressed.  A limited physical exam was performed with this format.  Please refer to the patient's chart for her consent to telehealth [facility].   Date:  11/06/2018   ID:  Judith Bass, DOB [birth year], MRN [id number]  Patient Location: Home Provider Location: Home  PCP:  Lise AuerKhan, Jaber A, MD  Cardiologist:  No primary care provider on file.  Electrophysiologist:  None   Evaluation Performed:  Consultation - Judith Bass was referred by Dr Welton FlakesKhan for the evaluation of chest discomfort.  Chief Complaint:  Chest discomfort  History of Present Illness:    Judith Bass is a 53 y.o. female with past medical history of fibromyalgia.  Patient mentions to me that she went to the hospital with chest discomfort.  It radiated to the right arm.  So she was concerned.  No orthopnea or PND.  She does not have any of the symptoms with activities of daily living.  Also sexual activity does not reproduce the symptoms.  She leads a sedentary lifestyle.  At the time of my evaluation, the patient is alert awake oriented and in no distress.  The patient does not have symptoms concerning for COVID-19 infection (fever, chills, cough, or new shortness of breath).    Past Medical History:  Diagnosis Date  . ABDOMINAL PAIN 03/09/2008   Qualifier: Diagnosis of  By: Plotnikov MD, Georgina QuintAleksei V   . Anxiety   . ANXIETY 03/10/2007   Qualifier: Diagnosis of  By: Posey ReaPlotnikov MD, Georgina QuintAleksei V   . ASTHMA  01/09/2007   Qualifier: Diagnosis of  By: Tora PerchesGrace, Vanessa    . CHEST PAIN 09/03/2007   Qualifier: Diagnosis of  By: Jonny RuizJohn MD, Len BlalockJames W   . Cough 08/09/2007   Qualifier: Diagnosis of  By: Posey ReaPlotnikov MD, Georgina QuintAleksei V   . Depression   . Diarrhea 03/09/2008   Qualifier: Diagnosis of  By: Plotnikov MD, Georgina QuintAleksei V   . DISORDER, FUNCTIONAL, INTESTINE NOS 03/04/2007   Qualifier: Diagnosis of  By: Plotnikov MD, Georgina QuintAleksei V   . GERD 03/10/2007   Qualifier: Diagnosis of  By: Plotnikov MD, Georgina QuintAleksei V   . Irritable bowel syndrome 06/11/2007   Centricity Description: IRRITABLE BOWEL SYNDROME Qualifier: Diagnosis of  By: Posey ReaPlotnikov MD, Georgina QuintAleksei V  Centricity Description: IBS Qualifier: Diagnosis of  By: Jonny RuizJohn MD, Len BlalockJames W   . LOW BACK PAIN 06/11/2007   Qualifier: Diagnosis of  By: Posey ReaPlotnikov MD, Georgina QuintAleksei V   . MIGRAINES, HX OF 01/09/2007   Qualifier: Diagnosis of  By: Tora PerchesGrace, Vanessa    . SINUSITIS, ACUTE 10/02/2007   Qualifier: Diagnosis of  By: Plotnikov MD, Georgina QuintAleksei V   . UPPER RESPIRATORY INFECTION (URI) 08/09/2007   Qualifier: Diagnosis of  By: Posey ReaPlotnikov MD, Georgina QuintAleksei V   . WEIGHT LOSS 03/04/2007   Qualifier: Diagnosis of  By: Posey ReaPlotnikov MD, Georgina QuintAleksei V    History reviewed. No pertinent surgical history.   Current Meds  Medication Sig  . albuterol (PROVENTIL HFA;VENTOLIN HFA) 108 (90 Base) MCG/ACT inhaler Inhale  into the lungs every 6 (six) hours as needed for wheezing or shortness of breath.  Marland Kitchen amitriptyline (ELAVIL) 10 MG tablet TAKE 1 TABLET BY MOUTH AT BEDTIME FOR BLADDER SPASM  . amphetamine-dextroamphetamine (ADDERALL) 20 MG tablet TAKE 1 TABLET BY MOUTH TWICE DAILY AT 8 AM AND NOON.  . cyclobenzaprine (FLEXERIL) 5 MG tablet Take 1 tablet (5 mg total) by mouth 3 (three) times daily as needed. Needs appt for refills.  Marland Kitchen FLUoxetine (PROZAC) 20 MG capsule Take 20 mg by mouth daily.  Marland Kitchen LORazepam (ATIVAN) 1 MG tablet Take 1 mg by mouth every 8 (eight) hours.  Marland Kitchen omeprazole (PRILOSEC) 20 MG capsule Take 1 capsule (20 mg  total) by mouth daily.  . pramipexole (MIRAPEX) 0.125 MG tablet Take 0.125 mg by mouth 2 times daily at 12 noon and 4 pm.  . propranolol (INDERAL) 20 MG tablet Take 1 tablet by mouth twice daily  . QUETIAPINE FUMARATE PO Take 100 mg by mouth.  . SUMAtriptan (IMITREX) 100 MG tablet Take 100 mg by mouth every 2 (two) hours as needed for migraine. May repeat in 2 hours if headache persists or recurs.     Allergies:   Patient has no known allergies.   Social History   Tobacco Use  . Smoking status: Current Every Day Smoker  . Smokeless tobacco: Never Used  . Tobacco comment: Patient stated she quit smoking last February.   Substance Use Topics  . Alcohol use: Not Currently  . Drug use: Not Currently     Family Hx: The patient's family history includes Bipolar disorder in her mother and sister; Stroke in her maternal grandfather, maternal grandmother, and sister.  ROS:   Please see the history of present illness.    As above All other systems reviewed and are negative.   Prior CV studies:   The following studies were reviewed today:  I reviewed records from primary care physician's office and the emergency room records from [place]  Labs/Other Tests and Data Reviewed:    EKG:  10/16/18 ekg report t wave inversion lateral lead  Recent Labs: 07/23/2018: ALT 25; BUN 14; Creatinine, Ser 0.90; Hemoglobin 14.1; Platelets 314; Potassium 4.4; Sodium 138; TSH 1.460   Recent Lipid Panel Lab Results  Component Value Date/Time   CHOL 264 (H) 07/23/2018 03:40 PM   TRIG 211 (H) 07/23/2018 03:40 PM   HDL 48 07/23/2018 03:40 PM   CHOLHDL 5.5 (H) 07/23/2018 03:40 PM   CHOLHDL 3.4 CALC 09/24/2006 09:16 AM   LDLCALC 174 (H) 07/23/2018 03:40 PM    Wt Readings from Last 3 Encounters:  11/06/18 176 lb (79.8 kg)  07/23/18 184 lb 6.4 oz (83.6 kg)  03/04/07 123 lb (55.8 kg)     Objective:    Vital Signs:  BP (!) 143/72   Pulse 95   Ht  (1.626 m)   Wt 176 lb (79.8 kg)   BMI  30.21 kg/m    VITAL SIGNS:  reviewed  ASSESSMENT & PLAN:    1. Chest discomfort: I mentioned to the patient's that her symptoms are atypical for coronary etiology.  But in view of multiple risk factors we will do a Lexiscan sestamibi as soon as there is a available spot in her schedule in our office in [place].  In the interim following recommendations were made in nitroglycerin prescription was sent 2. Sublingual nitroglycerin prescription was sent, its protocol and 911 protocol explained and the patient vocalized understanding questions were answered to the patient's  satisfaction 3. Essential hypertension: Her blood pressure is stable.  She might have an element of whitecoat hypertension.  She has spells of dizziness and may some orthostatic hypotension at times and precautions were advised 4. She occasionally has palpitations and we will put a two-week ZIO monitor 5. Cigarette smoking: I spent 5 minutes with the patient discussing solely about smoking. Smoking cessation was counseled. I suggested to the patient also different medications and pharmacological interventions. Patient is keen to try stopping on its own at this time. He will get back to me if he needs any further assistance in this matter. 6. Follow-up appointment in a month or earlier if she has any concerns.  COVID-19 Education: The signs and symptoms of COVID-19 were discussed with the patient and how to seek care for testing (follow up with PCP or arrange E-visit).  The importance of social distancing was discussed today.  Time:   Today, I have spent 35 minutes with the patient with telehealth technology discussing the above problems.  Total time for this evaluation including record review was 45 minutes.   Medication Adjustments/Labs and Tests Ordered: Current medicines are reviewed at length with the patient today.  Concerns regarding medicines are outlined above.   Tests Ordered: No orders of the defined types were  placed in this encounter.   Medication Changes: No orders of the defined types were placed in this encounter.   Disposition:  Follow up in 1 month(s)  Signed, Garwin Brothers, MD  11/06/2018 2:05 PM    [facility]

## 2018-11-06 NOTE — Patient Instructions (Signed)
Medication Instructions:  Your physician has recommended you make the following change in your medication:   START taking nitroglycerin as needed for chest pain When having chest pain, stop what you are doing and sit down. Take 1 nitro, wait 5 minutes. Still having chest pain, take 1 nitro, wait 5 minutes. Still having chest pain, take 1 nitro, dial 911. Total of 3 nitro in 15 minutes.  If you need a refill on your cardiac medications before your next appointment, please call your pharmacy.   Lab work: NONE If you have labs (blood work) drawn today and your tests are completely normal, you will receive your results only by: Marland Kitchen. MyChart Message (if you have MyChart) OR . A paper copy in the mail If you have any lab test that is abnormal or we need to change your treatment, we will call you to review the results.  Testing/Procedures: Your physician has recommended that you wear a ZIO monitor. ZIO Holter monitors are medical devices that record the heart's electrical activity. Doctors most often use these monitors to diagnose arrhythmias. Arrhythmias are problems with the speed or rhythm of the heartbeat. The monitor is a small, portable device. You can wear one while you do your normal daily activities. This is usually used to diagnose what is causing palpitations/syncope (passing out).You will wear this device for 14 days.   Your physician has requested that you have a lexiscan myoview. For further information please visit [email or web address]. Please follow instruction sheet, as given.     [facility] [address] [place], [place] [address] Phone:  [phone]  Nov 06, 2018    Judith SicksKimberly J Bass DOB: May 03, 1966 MRN: [id number] [address] [place] [address]   Dear Judith Bass,  You are scheduled for a Myocardial Perfusion Imaging Study on:  Nov 07, 2018 at 0745.  Please arrive 15 minutes prior to your appointment time for registration and  insurance purposes.  The test will take approximately 3 to 4 hours to complete; you may bring reading material.  If someone comes with you to your appointment, they will need to remain in the main lobby due to limited space in the testing area. **If you are pregnant or breastfeeding, please notify the nuclear lab prior to your appointment**  How to prepare for your Myocardial Perfusion Test: . Do not eat or drink 3 hours prior to your test, except you may have water. . Do not consume products containing caffeine (regular or decaffeinated) 12 hours prior to your test. (ex: coffee, chocolate, sodas, tea). Do bring a list of your current medications with you.  If not listed below, you may take your medications as normal. . Do not take propranolol (inderal, Innopran) for 24 hours prior to the test.  Bring the medication to your appointment as you may be required to take it once the test is complete. . Do wear comfortable clothes (no dresses or overalls) and walking shoes, tennis shoes preferred (No heels or open toe shoes are allowed). . Do NOT wear cologne, perfume, aftershave, or lotions (deodorant is allowed). . If these instructions are not followed, your test will have to be rescheduled.  Please report to [address] for your test.  If you have questions or concerns about your appointment, you can call the [facility] at [phone].  If you cannot keep your appointment, please provide 24 hours notification to the Nuclear Lab, to avoid a possible $50  charge to your account.   Follow-Up: At [facility], you and your health needs are our priority.  As part of our continuing mission to provide you with exceptional heart care, we have created designated Provider Care Teams.  These Care Teams include your primary Cardiologist (physician) and Advanced Practice Providers (APPs -  Physician Assistants and Nurse Practitioners) who all work together to provide  you with the care you need, when you need it. You will need a follow up appointment in 1 months.    Any Other Special Instructions Will Be Listed Below  Regadenoson injection What is this medicine? REGADENOSON is used to test the heart for coronary artery disease. It is used in patients who can not exercise for their stress test. This medicine may be used for other purposes; ask your health care provider or pharmacist if you have questions. COMMON BRAND NAME(S): Lexiscan What should I tell my health care provider before I take this medicine? They need to know if you have any of these conditions: -heart problems -lung or breathing disease, like asthma or COPD -an unusual or allergic reaction to regadenoson, other medicines, foods, dyes, or preservatives -pregnant or trying to get pregnant -breast-feeding How should I use this medicine? This medicine is for injection into a vein. It is given by a health care professional in a hospital or clinic setting. Talk to your pediatrician regarding the use of this medicine in children. Special care may be needed. Overdosage: If you think you have taken too much of this medicine contact a poison control center or emergency room at once. NOTE: This medicine is only for you. Do not share this medicine with others. What if I miss a dose? This does not apply. What may interact with this medicine? -caffeine -dipyridamole -guarana -theophylline This list may not describe all possible interactions. Give your health care provider a list of all the medicines, herbs, non-prescription drugs, or dietary supplements you use. Also tell them if you smoke, drink alcohol, or use illegal drugs. Some items may interact with your medicine. What should I watch for while using this medicine? Your condition will be monitored carefully while you are receiving this medicine. Do not take medicines, foods, or drinks with caffeine (like coffee, tea, or colas) for at least 12  hours before your test. If you do not know if something contains caffeine, ask your health care professional. What side effects may I notice from receiving this medicine? Side effects that you should report to your doctor or health care professional as soon as possible: -allergic reactions like skin rash, itching or hives, swelling of the face, lips, or tongue -breathing problems -chest pain, tightness or palpitations -severe headache Side effects that usually do not require medical attention (report to your doctor or health care professional if they continue or are bothersome): -flushing -headache -irritation or pain at site where injected -nausea, vomiting This list may not describe all possible side effects. Call your doctor for medical advice about side effects. You may report side effects to FDA at 1-800-FDA-1088. Where should I keep my medicine? This drug is given in a hospital or clinic and will not be stored at home. NOTE: This sheet is a summary. It may not cover all possible information. If you have questions about this medicine, talk to your doctor, pharmacist, or health care provider.  2019 Elsevier/Gold Standard (2008-02-03 15:08:13)   Cardiac Nuclear Scan A cardiac nuclear scan is a test that is done to check the flow of  blood to your heart. It is done when you are resting and when you are exercising. The test looks for problems such as:  Not enough blood reaching a portion of the heart.  The heart muscle not working as it should. You may need this test if:  You have heart disease.  You have had lab results that are not normal.  You have had heart surgery or a balloon procedure to open up blocked arteries (angioplasty).  You have chest pain.  You have shortness of breath. In this test, a special dye (tracer) is put into your bloodstream. The tracer will travel to your heart. A camera will then take pictures of your heart to see how the tracer moves through your  heart. This test is usually done at a hospital and takes 2-4 hours. Tell a doctor about:  Any allergies you have.  All medicines you are taking, including vitamins, herbs, eye drops, creams, and over-the-counter medicines.  Any problems you or family members have had with anesthetic medicines.  Any blood disorders you have.  Any surgeries you have had.  Any medical conditions you have.  Whether you are pregnant or may be pregnant. What are the risks? Generally, this is a safe test. However, problems may occur, such as:  Serious chest pain and heart attack. This is only a risk if the stress portion of the test is done.  Rapid heartbeat.  A feeling of warmth in your chest. This feeling usually does not last long.  Allergic reaction to the tracer. What happens before the test?  Ask your doctor about changing or stopping your normal medicines. This is important.  Follow instructions from your doctor about what you cannot eat or drink.  Remove your jewelry on the day of the test. What happens during the test?  An IV tube will be inserted into one of your veins.  Your doctor will give you a small amount of tracer through the IV tube.  You will wait for 20-40 minutes while the tracer moves through your bloodstream.  Your heart will be monitored with an electrocardiogram (ECG).  You will lie down on an exam table.  Pictures of your heart will be taken for about 15-20 minutes.  You may also have a stress test. For this test, one of these things may be done: ? You will be asked to exercise on a treadmill or a stationary bike. ? You will be given medicines that will make your heart work harder. This is done if you are unable to exercise.  When blood flow to your heart has peaked, a tracer will again be given through the IV tube.  After 20-40 minutes, you will get back on the exam table. More pictures will be taken of your heart.  Depending on the tracer that is used, more  pictures may need to be taken 3-4 hours later.  Your IV tube will be removed when the test is over. The test may vary among doctors and hospitals. What happens after the test?  Ask your doctor: ? Whether you can return to your normal schedule, including diet, activities, and medicines. ? Whether you should drink more fluids. This will help to remove the tracer from your body. Drink enough fluid to keep your pee (urine) pale yellow.  Ask your doctor, or the department that is doing the test: ? When will my results be ready? ? How will I get my results? Summary  A cardiac nuclear scan is a test  that is done to check the flow of blood to your heart.  Tell your doctor whether you are pregnant or may be pregnant.  Before the test, ask your doctor about changing or stopping your normal medicines. This is important.  Ask your doctor whether you can return to your normal activities. You may be asked to drink more fluids. This information is not intended to replace advice given to you by your health care provider. Make sure you discuss any questions you have with your health care provider. Document Released: 11/19/2017 Document Revised: 11/19/2017 Document Reviewed: 11/19/2017 Elsevier Interactive Patient Education  2019 [employer].

## 2018-11-06 NOTE — Addendum Note (Signed)
Addended by: Pamala Hurry on: 11/06/2018 02:53 PM   Modules accepted: Orders

## 2018-11-12 ENCOUNTER — Telehealth: Payer: Self-pay | Admitting: *Deleted

## 2018-11-12 NOTE — Telephone Encounter (Signed)
Patient given detailed instructions per Myocardial Perfusion Study Information Sheet for the test on 11/14/18. Patient notified to arrive 15 minutes early and that it is imperative to arrive on time for appointment to keep from having the test rescheduled.  If you need to cancel or reschedule your appointment, please call the office within 24 hours of your appointment. . Patient verbalized understanding. Clista Rainford Jacqueline, RN    

## 2018-11-14 ENCOUNTER — Other Ambulatory Visit: Payer: Self-pay

## 2018-11-14 ENCOUNTER — Other Ambulatory Visit: Payer: Self-pay | Admitting: Nurse Practitioner

## 2018-11-14 ENCOUNTER — Ambulatory Visit (INDEPENDENT_AMBULATORY_CARE_PROVIDER_SITE_OTHER): Payer: Medicaid Other

## 2018-11-14 DIAGNOSIS — R079 Chest pain, unspecified: Secondary | ICD-10-CM | POA: Diagnosis not present

## 2018-11-14 DIAGNOSIS — F329 Major depressive disorder, single episode, unspecified: Secondary | ICD-10-CM

## 2018-11-14 DIAGNOSIS — F419 Anxiety disorder, unspecified: Secondary | ICD-10-CM

## 2018-11-14 LAB — MYOCARDIAL PERFUSION IMAGING
LV dias vol: 77 mL (ref 46–106)
LV sys vol: 28 mL
Peak HR: 111 {beats}/min
Rest HR: 90 {beats}/min
SDS: 4
SRS: 11
SSS: 15
TID: 1.09

## 2018-11-14 MED ORDER — TECHNETIUM TC 99M TETROFOSMIN IV KIT
32.1000 | PACK | Freq: Once | INTRAVENOUS | Status: AC | PRN
  Administered 2018-11-14: 32.1 via INTRAVENOUS

## 2018-11-14 MED ORDER — TECHNETIUM TC 99M TETROFOSMIN IV KIT
11.0000 | PACK | Freq: Once | INTRAVENOUS | Status: AC | PRN
  Administered 2018-11-14: 11 via INTRAVENOUS

## 2018-11-14 MED ORDER — REGADENOSON 0.4 MG/5ML IV SOLN
0.4000 mg | Freq: Once | INTRAVENOUS | Status: AC
  Administered 2018-11-14: 0.4 mg via INTRAVENOUS

## 2018-11-15 ENCOUNTER — Other Ambulatory Visit: Payer: Self-pay | Admitting: Family Medicine

## 2018-11-15 ENCOUNTER — Other Ambulatory Visit: Payer: Self-pay | Admitting: Nurse Practitioner

## 2018-11-15 DIAGNOSIS — F419 Anxiety disorder, unspecified: Secondary | ICD-10-CM

## 2018-11-15 DIAGNOSIS — F329 Major depressive disorder, single episode, unspecified: Secondary | ICD-10-CM

## 2018-11-19 ENCOUNTER — Telehealth: Payer: Self-pay

## 2018-11-19 NOTE — Telephone Encounter (Signed)
Information relayed to patient with no further questions. Copy of info sent to Dr. Welton Flakes office per Dr. Janey Genta request.

## 2018-11-19 NOTE — Telephone Encounter (Signed)
-----   Message from Garwin Brothers, MD sent at 11/14/2018  3:24 PM EDT ----- The results of the study is unremarkable. Please inform patient. I will discuss in detail at next appointment. Cc  primary care/referring physician Garwin Brothers, MD 11/14/2018 3:24 PM

## 2018-11-20 ENCOUNTER — Telehealth: Payer: Self-pay

## 2018-11-20 NOTE — Telephone Encounter (Signed)
Per patient, she had an allergic reaction to [employer] monitor causing itching therefore it was removed. RN spoke with A.Harris, CMA and there is an alternative brand of monitor called preventice that is hypoallergenic. Information relayed to Dr. Janey Genta for advisement to change order for ambulatory monitoring?

## 2018-11-20 NOTE — Telephone Encounter (Signed)
Ok. That was my recommendation

## 2018-11-22 NOTE — Telephone Encounter (Signed)
The order in epic can stay the same. I will need to cancel the monitor at [facility] and order 1 from [employer]. There is nothing that you need to do.

## 2018-12-04 ENCOUNTER — Other Ambulatory Visit: Payer: Self-pay | Admitting: Nurse Practitioner

## 2018-12-04 DIAGNOSIS — K219 Gastro-esophageal reflux disease without esophagitis: Secondary | ICD-10-CM

## 2018-12-09 ENCOUNTER — Telehealth (INDEPENDENT_AMBULATORY_CARE_PROVIDER_SITE_OTHER): Payer: Medicaid Other | Admitting: Cardiology

## 2018-12-09 ENCOUNTER — Other Ambulatory Visit: Payer: Self-pay

## 2018-12-09 ENCOUNTER — Encounter: Payer: Self-pay | Admitting: Cardiology

## 2018-12-09 VITALS — Ht 64.0 in | Wt 176.0 lb

## 2018-12-09 DIAGNOSIS — Z87891 Personal history of nicotine dependence: Secondary | ICD-10-CM

## 2018-12-09 DIAGNOSIS — R002 Palpitations: Secondary | ICD-10-CM

## 2018-12-09 DIAGNOSIS — E782 Mixed hyperlipidemia: Secondary | ICD-10-CM | POA: Diagnosis not present

## 2018-12-09 DIAGNOSIS — M797 Fibromyalgia: Secondary | ICD-10-CM | POA: Diagnosis not present

## 2018-12-09 HISTORY — DX: Personal history of nicotine dependence: Z87.891

## 2018-12-09 NOTE — Addendum Note (Signed)
Addended by: Beckey Rutter on: 12/09/2018 04:32 PM   Modules accepted: Orders

## 2018-12-09 NOTE — Progress Notes (Signed)
Virtual Visit via Video Note   This visit type was conducted due to national recommendations for restrictions regarding the COVID-19 Pandemic (e.g. social distancing) in an effort to limit this patient's exposure and mitigate transmission in our community.  Due to her co-morbid illnesses, this patient is at least at moderate risk for complications without adequate follow up.  This format is felt to be most appropriate for this patient at this time.  All issues noted in this document were discussed and addressed.  A limited physical exam was performed with this format.  Please refer to the patient's chart for her consent to telehealth for [facility].   Date:  12/09/2018   ID:  Judith Bass, DOB August 22, 1965, MRN [id number]  Patient Location: Home Provider Location: Office  PCP:  Mateo Flow, MD  Cardiologist:  No primary care provider on file.  Electrophysiologist:  None   Evaluation Performed:  Follow-Up Visit  Chief Complaint: Follow-up for palpitations and chest discomfort  History of Present Illness:    Judith Bass is a 53 y.o. female with past medical history of palpitations and fibromyalgia and chest discomfort.  Her stress test has come out negative for ischemia.  She denies any problems at this time she occasionally has palpitations and is being monitored at this time.  No dizziness no orthopnea no syncope.  Her lipids are extremely high.  Her LDL is 174.  I congratulated her on the fact that since he saw me last she quit smoking.  At the time of my evaluation, the patient is alert awake oriented and in no distress.  The patient does not have symptoms concerning for COVID-19 infection (fever, chills, cough, or new shortness of breath).    Past Medical History:  Diagnosis Date  . ABDOMINAL PAIN 03/09/2008   Qualifier: Diagnosis of  By: Plotnikov MD, Evie Lacks   . Anxiety   . ANXIETY 03/10/2007   Qualifier: Diagnosis of  By: Alain Marion MD, [place]  01/09/2007   Qualifier: Diagnosis of  By: Doralee Albino    . CHEST PAIN 09/03/2007   Qualifier: Diagnosis of  By: Jenny Reichmann MD, Hunt Oris   . Cough 08/09/2007   Qualifier: Diagnosis of  By: Alain Marion MD, Evie Lacks Depression   . Diarrhea 03/09/2008   Qualifier: Diagnosis of  By: Plotnikov MD, [place], FUNCTIONAL, INTESTINE NOS 03/04/2007   Qualifier: Diagnosis of  By: Plotnikov MD, Evie Lacks   . GERD 03/10/2007   Qualifier: Diagnosis of  By: Plotnikov MD, Evie Lacks   . Irritable bowel syndrome 06/11/2007   Centricity Description: IRRITABLE BOWEL SYNDROME Qualifier: Diagnosis of  By: Alain Marion MD, Evie Lacks  Centricity Description: IBS Qualifier: Diagnosis of  By: Jenny Reichmann MD, Hunt Oris   . LOW BACK PAIN 06/11/2007   Qualifier: Diagnosis of  By: Alain Marion MD, Evie Lacks MIGRAINES, HX OF 01/09/2007   Qualifier: Diagnosis of  By: Doralee Albino    . SINUSITIS, ACUTE 10/02/2007   Qualifier: Diagnosis of  By: Plotnikov MD, Evie Lacks UPPER RESPIRATORY INFECTION (URI) 08/09/2007   Qualifier: Diagnosis of  By: Alain Marion MD, Evie Lacks WEIGHT LOSS 03/04/2007   Qualifier: Diagnosis of  By: Alain Marion MD, Evie Lacks    History reviewed. No pertinent surgical history.   Current Meds  Medication Sig  . albuterol (PROVENTIL HFA;VENTOLIN HFA) 108 (90 Base) MCG/ACT inhaler Inhale into the lungs  every 6 (six) hours as needed for wheezing or shortness of breath.  . amphetamine-dextroamphetamine (ADDERALL) 20 MG tablet Take 20 mg by mouth daily.   . cyclobenzaprine (FLEXERIL) 5 MG tablet TAKE 1 TABLET BY MOUTH THREE TIMES DAILY AS NEEDED FOR MUSCLE SPASMS  . FLUoxetine (PROZAC) 20 MG capsule Take 20 mg by mouth daily.  Marland Kitchen. LORazepam (ATIVAN) 1 MG tablet Take 1 mg by mouth every 8 (eight) hours.  . nitroGLYCERIN (NITROSTAT) 0.4 MG SL tablet Place 1 tablet (0.4 mg total) under the tongue every 5 (five) minutes as needed.  Marland Kitchen. omeprazole (PRILOSEC) 20 MG capsule Take 1 capsule by mouth once daily  .  pramipexole (MIRAPEX) 0.125 MG tablet Take 0.125 mg by mouth 2 times daily at 12 noon and 4 pm.  . propranolol (INDERAL) 20 MG tablet Take 1 tablet by mouth twice daily  . QUETIAPINE FUMARATE PO Take 100 mg by mouth.  . SUMAtriptan (IMITREX) 100 MG tablet Take 100 mg by mouth every 2 (two) hours as needed for migraine. May repeat in 2 hours if headache persists or recurs.     Allergies:   Adhesive [tape]   Social History   Tobacco Use  . Smoking status: Current Every Day Smoker  . Smokeless tobacco: Never Used  . Tobacco comment: Patient stated she quit smoking last February.   Substance Use Topics  . Alcohol use: Not Currently  . Drug use: Not Currently     Family Hx: The patient's family history includes Bipolar disorder in her mother and sister; Stroke in her maternal grandfather, maternal grandmother, and sister.  ROS:   Please see the history of present illness.    As mentioned above All other systems reviewed and are negative.   Prior CV studies:   The following studies were reviewed today:  Stress test did not reveal any evidence of ischemia.  Patient is currently undergoing monitoring.  Labs/Other Tests and Data Reviewed:    EKG:  No ECG reviewed.  Recent Labs: 07/23/2018: ALT 25; BUN 14; Creatinine, Ser 0.90; Hemoglobin 14.1; Platelets 314; Potassium 4.4; Sodium 138; TSH 1.460   Recent Lipid Panel Lab Results  Component Value Date/Time   CHOL 264 (H) 07/23/2018 03:40 PM   TRIG 211 (H) 07/23/2018 03:40 PM   HDL 48 07/23/2018 03:40 PM   CHOLHDL 5.5 (H) 07/23/2018 03:40 PM   CHOLHDL 3.4 CALC 09/24/2006 09:16 AM   LDLCALC 174 (H) 07/23/2018 03:40 PM    Wt Readings from Last 3 Encounters:  12/09/18 176 lb (79.8 kg)  11/14/18 176 lb (79.8 kg)  11/06/18 176 lb (79.8 kg)     Objective:    Vital Signs:  Ht 5\' 4"  (1.626 m)   Wt 176 lb (79.8 kg)   BMI 30.21 kg/m    VITAL SIGNS:  reviewed  ASSESSMENT & PLAN:    1. Chest discomfort: This is currently  resolved.  She is very happy to know the results of the stress test and wants to start an exercise plan and I told her half an hour at least every day 5 days a week and she vocalized understanding. 2. Palpitations: The patient is being monitored.  TSH in the past has been unremarkable.  3.  Mixed dyslipidemia: Diet was discussed at extensive mention and she vocalized understanding and plans to diet well with a low carb low fat diet we will recheck her blood work before her 3-week appointment.  She has been advised to come back a week before and  do a Chem-7 liver lipid check. 4.  Patient was advised to never go back to smoking.  She quit smoking after she saw me last time.  I congratulated her on this. She will be seen in follow-up appointment in 4 months or earlier if she has any concerns.  COVID-19 Education: The signs and symptoms of COVID-19 were discussed with the patient and how to seek care for testing (follow up with PCP or arrange E-visit).  The importance of social distancing was discussed today.  Time:   Today, I have spent 15 minutes with the patient with telehealth technology discussing the above problems.     Medication Adjustments/Labs and Tests Ordered: Current medicines are reviewed at length with the patient today.  Concerns regarding medicines are outlined above.   Tests Ordered: No orders of the defined types were placed in this encounter.   Medication Changes: No orders of the defined types were placed in this encounter.   Follow Up:  Virtual Visit or In Person in 3 month(s)  Signed, Garwin Brothersajan R Idali Lafever, MD  12/09/2018 3:05 PM    [facility]

## 2018-12-09 NOTE — Patient Instructions (Signed)
Medication Instructions:  Your physician recommends that you continue on your current medications as directed. Please refer to the Current Medication list given to you today.  If you need a refill on your cardiac medications before your next appointment, please call your pharmacy.   Lab work: Your physician recommends that you return in 3 mo FASTING for lipid, liver and BMP to be drawn  If you have labs (blood work) drawn today and your tests are completely normal, you will receive your results only by: Marland Kitchen MyChart Message (if you have MyChart) OR . A paper copy in the mail If you have any lab test that is abnormal or we need to change your treatment, we will call you to review the results.  Testing/Procedures: NONE Follow-Up: At [facility], you and your health needs are our priority.  As part of our continuing mission to provide you with exceptional heart care, we have created designated Provider Care Teams.  These Care Teams include your primary Cardiologist (physician) and Advanced Practice Providers (APPs -  Physician Assistants and Nurse Practitioners) who all work together to provide you with the care you need, when you need it. You will need a follow up appointment in 4 months.

## 2018-12-14 ENCOUNTER — Other Ambulatory Visit: Payer: Self-pay | Admitting: Family Medicine

## 2018-12-19 ENCOUNTER — Other Ambulatory Visit: Payer: Self-pay | Admitting: Nurse Practitioner

## 2018-12-19 ENCOUNTER — Other Ambulatory Visit: Payer: Self-pay | Admitting: Family Medicine

## 2018-12-19 DIAGNOSIS — F329 Major depressive disorder, single episode, unspecified: Secondary | ICD-10-CM

## 2018-12-19 DIAGNOSIS — F419 Anxiety disorder, unspecified: Secondary | ICD-10-CM

## 2018-12-22 ENCOUNTER — Other Ambulatory Visit: Payer: Self-pay | Admitting: Nurse Practitioner

## 2018-12-22 ENCOUNTER — Other Ambulatory Visit: Payer: Self-pay | Admitting: Family Medicine

## 2018-12-29 ENCOUNTER — Other Ambulatory Visit: Payer: Self-pay | Admitting: Family Medicine

## 2018-12-29 ENCOUNTER — Other Ambulatory Visit: Payer: Self-pay | Admitting: Nurse Practitioner

## 2019-01-22 ENCOUNTER — Telehealth: Payer: Self-pay | Admitting: *Deleted

## 2019-01-22 NOTE — Telephone Encounter (Signed)
Spoke with pt about preventice monitor for sensitive skin that we had sent to her. She tried to wear the monitor but stated it was very heavy and would not stick to her skin. She wanted me to let Dr. Geraldo Pitter know that the monitor didn't work out. She sent it back and cancelled the order.

## 2019-02-11 DIAGNOSIS — M5441 Lumbago with sciatica, right side: Secondary | ICD-10-CM

## 2019-02-11 DIAGNOSIS — G8929 Other chronic pain: Secondary | ICD-10-CM

## 2019-02-11 DIAGNOSIS — M542 Cervicalgia: Secondary | ICD-10-CM

## 2019-02-11 DIAGNOSIS — M5416 Radiculopathy, lumbar region: Secondary | ICD-10-CM

## 2019-02-11 DIAGNOSIS — M7918 Myalgia, other site: Secondary | ICD-10-CM | POA: Insufficient documentation

## 2019-02-11 HISTORY — DX: Cervicalgia: M54.2

## 2019-02-11 HISTORY — DX: Other chronic pain: G89.29

## 2019-02-11 HISTORY — DX: Myalgia, other site: M79.18

## 2019-02-11 HISTORY — DX: Lumbago with sciatica, right side: M54.41

## 2019-02-11 HISTORY — DX: Radiculopathy, lumbar region: M54.16

## 2019-03-28 ENCOUNTER — Other Ambulatory Visit: Payer: Self-pay | Admitting: Family Medicine

## 2019-03-28 DIAGNOSIS — F419 Anxiety disorder, unspecified: Secondary | ICD-10-CM

## 2019-03-28 DIAGNOSIS — F329 Major depressive disorder, single episode, unspecified: Secondary | ICD-10-CM

## 2019-04-10 DIAGNOSIS — R519 Headache, unspecified: Secondary | ICD-10-CM | POA: Insufficient documentation

## 2019-04-10 DIAGNOSIS — G8929 Other chronic pain: Secondary | ICD-10-CM

## 2019-04-10 HISTORY — DX: Other chronic pain: G89.29

## 2019-04-10 HISTORY — DX: Headache, unspecified: R51.9

## 2019-04-11 ENCOUNTER — Other Ambulatory Visit: Payer: Self-pay | Admitting: Cardiology

## 2019-04-14 ENCOUNTER — Telehealth: Payer: Medicaid Other | Admitting: Cardiology

## 2019-04-25 ENCOUNTER — Ambulatory Visit: Payer: Medicaid Other | Admitting: Cardiology

## 2019-04-28 ENCOUNTER — Telehealth: Payer: Self-pay

## 2019-04-28 NOTE — Telephone Encounter (Signed)
Phoned patient, no answer, left voice message requesting return call to discuss concerns with call back number.

## 2019-05-06 ENCOUNTER — Telehealth: Payer: Self-pay

## 2019-05-06 ENCOUNTER — Telehealth: Payer: Medicaid Other | Admitting: Cardiology

## 2019-05-06 ENCOUNTER — Other Ambulatory Visit: Payer: Self-pay

## 2019-05-06 NOTE — Telephone Encounter (Signed)
I attempted calling the patient three times with no success. All three times went straight to the voicemail.

## 2019-05-08 ENCOUNTER — Telehealth: Payer: Medicaid Other | Admitting: Cardiology

## 2019-05-14 ENCOUNTER — Telehealth: Payer: Self-pay | Admitting: Cardiology

## 2019-05-14 NOTE — Telephone Encounter (Signed)
Patient is calling stating her BP is 151/81 and her pulse is 114. She also states she is having fatigue but not sure if fibromyalgia. Please advise.

## 2019-05-15 LAB — BASIC METABOLIC PANEL
BUN/Creatinine Ratio: 9 (ref 9–23)
BUN: 8 mg/dL (ref 6–24)
CO2: 19 mmol/L — ABNORMAL LOW (ref 20–29)
Calcium: 9.2 mg/dL (ref 8.7–10.2)
Chloride: 103 mmol/L (ref 96–106)
Creatinine, Ser: 0.89 mg/dL (ref 0.57–1.00)
GFR calc Af Amer: 87 mL/min/{1.73_m2} (ref >59)
GFR calc non Af Amer: 76 mL/min/{1.73_m2} (ref >59)
Glucose: 102 mg/dL — ABNORMAL HIGH (ref 65–99)
Potassium: 4.3 mmol/L (ref 3.5–5.2)
Sodium: 140 mmol/L (ref 134–144)

## 2019-05-15 LAB — HEPATIC FUNCTION PANEL
ALT: 27 IU/L (ref 0–32)
AST: 19 IU/L (ref 0–40)
Albumin: 4.5 g/dL (ref 3.8–4.8)
Alkaline Phosphatase: 94 IU/L (ref 39–117)
Bilirubin Total: 0.2 mg/dL (ref 0.0–1.2)
Bilirubin, Direct: 0.09 mg/dL (ref 0.00–0.40)
Total Protein: 6.6 g/dL (ref 6.0–8.5)

## 2019-05-15 LAB — LIPID PANEL
Chol/HDL Ratio: 5.7 ratio — ABNORMAL HIGH (ref 0.0–4.4)
Cholesterol, Total: 232 mg/dL — ABNORMAL HIGH (ref 100–199)
HDL: 41 mg/dL (ref >39)
LDL Chol Calc (NIH): 150 mg/dL — ABNORMAL HIGH (ref 0–99)
Triglycerides: 222 mg/dL — ABNORMAL HIGH (ref 0–149)
VLDL Cholesterol Cal: 41 mg/dL — ABNORMAL HIGH (ref 5–40)

## 2019-05-19 ENCOUNTER — Other Ambulatory Visit: Payer: Self-pay

## 2019-05-19 ENCOUNTER — Telehealth: Payer: Medicaid Other | Admitting: Cardiology

## 2019-05-19 NOTE — Telephone Encounter (Signed)
Patient scheduled to see Dr. Docia Furl on 05/19/2019

## 2019-05-20 ENCOUNTER — Ambulatory Visit: Payer: Medicaid Other | Admitting: Cardiology

## 2019-05-22 ENCOUNTER — Telehealth: Payer: Self-pay

## 2019-05-22 DIAGNOSIS — E782 Mixed hyperlipidemia: Secondary | ICD-10-CM

## 2019-05-22 MED ORDER — ROSUVASTATIN CALCIUM 10 MG PO TABS: 10.0000 mg | ORAL_TABLET | Freq: Every day | ORAL | 3 refills | Status: DC

## 2019-05-22 MED ORDER — AMLODIPINE BESYLATE 5 MG PO TABS: 5.0000 mg | ORAL_TABLET | Freq: Every day | ORAL | 3 refills | Status: DC

## 2019-05-22 NOTE — Telephone Encounter (Signed)
-----   Message from Jenean Lindau, MD sent at 05/16/2019  9:43 AM EST ----- Needs appt with dietician for low chol diet. Exercise. rosuva 10 and ll 6wks. Cc pcp Jenean Lindau, MD 05/16/2019 9:42 AM

## 2019-05-22 NOTE — Telephone Encounter (Signed)
Results relayed, per previous note Dr. Docia Furl requested patient be put on amlodipine 5 mg. Patient was also updated on dietician referral and starting rosuvastatin. Repeat labs ordered/copy sent to PCP.

## 2019-05-26 ENCOUNTER — Other Ambulatory Visit: Payer: Self-pay

## 2019-05-26 ENCOUNTER — Encounter: Payer: Self-pay | Admitting: Cardiology

## 2019-05-26 ENCOUNTER — Ambulatory Visit (INDEPENDENT_AMBULATORY_CARE_PROVIDER_SITE_OTHER): Payer: Medicaid Other | Admitting: Cardiology

## 2019-05-26 VITALS — BP 168/108 | HR 96 | Ht 64.5 in | Wt 178.6 lb

## 2019-05-26 DIAGNOSIS — R002 Palpitations: Secondary | ICD-10-CM | POA: Diagnosis not present

## 2019-05-26 DIAGNOSIS — Z87891 Personal history of nicotine dependence: Secondary | ICD-10-CM | POA: Diagnosis not present

## 2019-05-26 DIAGNOSIS — E782 Mixed hyperlipidemia: Secondary | ICD-10-CM

## 2019-05-26 NOTE — Patient Instructions (Signed)
Medication Instructions:  Your physician recommends that you continue on your current medications as directed. Please refer to the Current Medication list given to you today.  *If you need a refill on your cardiac medications before your next appointment, please call your pharmacy*  Lab Work: NONE If you have labs (blood work) drawn today and your tests are completely normal, you will receive your results only by: . MyChart Message (if you have MyChart) OR . A paper copy in the mail If you have any lab test that is abnormal or we need to change your treatment, we will call you to review the results.  Testing/Procedures: You had an EKG performed today  Follow-Up: At [facility], you and your health needs are our priority.  As part of our continuing mission to provide you with exceptional heart care, we have created designated Provider Care Teams.  These Care Teams include your primary Cardiologist (physician) and Advanced Practice Providers (APPs -  Physician Assistants and Nurse Practitioners) who all work together to provide you with the care you need, when you need it.  Your next appointment:   1 month(s)  The format for your next appointment:   In Person  Provider:   Rajan Revankar, MD    

## 2019-05-26 NOTE — Progress Notes (Signed)
Cardiology Office Note:    Date:  05/26/2019   ID:  Judith Bass, DOB 27-Nov-1965, MRN [id number]  PCP:  Mateo Flow, MD  Cardiologist:  Jenean Lindau, MD   Referring MD: Mateo Flow, MD    ASSESSMENT:    1. Ex-smoker   2. Palpitations   3. Mixed dyslipidemia    PLAN:    In order of problems listed above:  1. I reassured the patient about my findings.  Stress test report was discussed with her at length.  Her palpitations and postural hypotension issues have resolved and she feels much better and she will keep a track of her pulse blood pressure and write it down 3 times a day and get it back to me know months time. 2. Again her symptoms have resolved.  Unfortunately she has resumed smoking and I told her to quit smoking and the risks explained and she plans to do so. 3. She will be seen in follow-up appointment in a month or earlier if she has any concerns.   Medication Adjustments/Labs and Tests Ordered: Current medicines are reviewed at length with the patient today.  Concerns regarding medicines are outlined above.  No orders of the defined types were placed in this encounter.  No orders of the defined types were placed in this encounter.    History of Present Illness:    Judith Bass is a 53 y.o. female who is being seen today for the evaluation of palpitations at the request of Mateo Flow, MD.  Patient tells me that these have resolved significantly.  She did not undergo monitoring because of significant issues with allergies with patches she has used for monitoring.  Currently she feels fine and her palpitations have resolved significantly.  She does not have any postural hypotension based on the history provided by her.  At the time of my evaluation, the patient is alert awake oriented and in no distress.  Past Medical History:  Diagnosis Date  . ABDOMINAL PAIN 03/09/2008   Qualifier: Diagnosis of  By: Plotnikov MD, Evie Lacks   . Anxiety   .  ANXIETY 03/10/2007   Qualifier: Diagnosis of  By: Alain Marion MD, [place] 01/09/2007   Qualifier: Diagnosis of  By: Doralee Albino    . CHEST PAIN 09/03/2007   Qualifier: Diagnosis of  By: Jenny Reichmann MD, Hunt Oris   . Cough 08/09/2007   Qualifier: Diagnosis of  By: Alain Marion MD, Evie Lacks Depression   . Diarrhea 03/09/2008   Qualifier: Diagnosis of  By: Plotnikov MD, [place], FUNCTIONAL, INTESTINE NOS 03/04/2007   Qualifier: Diagnosis of  By: Plotnikov MD, Evie Lacks   . GERD 03/10/2007   Qualifier: Diagnosis of  By: Plotnikov MD, Evie Lacks   . Irritable bowel syndrome 06/11/2007   Centricity Description: IRRITABLE BOWEL SYNDROME Qualifier: Diagnosis of  By: Alain Marion MD, Evie Lacks  Centricity Description: IBS Qualifier: Diagnosis of  By: Jenny Reichmann MD, Hunt Oris   . LOW BACK PAIN 06/11/2007   Qualifier: Diagnosis of  By: Alain Marion MD, Evie Lacks MIGRAINES, HX OF 01/09/2007   Qualifier: Diagnosis of  By: Doralee Albino    . SINUSITIS, ACUTE 10/02/2007   Qualifier: Diagnosis of  By: Plotnikov MD, Evie Lacks UPPER RESPIRATORY INFECTION (URI) 08/09/2007   Qualifier: Diagnosis of  By: Alain Marion MD, Evie Lacks WEIGHT LOSS 03/04/2007   Qualifier: Diagnosis  of  By: Plotnikov MD, Georgina Quint     No past surgical history on file.  Current Medications: Current Meds  Medication Sig  . AIMOVIG 70 MG/ML SOAJ Inject 70 mg into the skin every 30 (thirty) days.  Marland Kitchen albuterol (PROVENTIL HFA;VENTOLIN HFA) 108 (90 Base) MCG/ACT inhaler Inhale into the lungs every 6 (six) hours as needed for wheezing or shortness of breath.  Marland Kitchen amLODipine (NORVASC) 5 MG tablet Take 1 tablet (5 mg total) by mouth daily.  Marland Kitchen amphetamine-dextroamphetamine (ADDERALL) 20 MG tablet Take 20 mg by mouth daily.   . Escitalopram Oxalate (LEXAPRO PO) Take 1 tablet by mouth daily.  Marland Kitchen gabapentin (NEURONTIN) 300 MG capsule Take 1 capsule by mouth daily.  Marland Kitchen lamoTRIgine (LAMICTAL) 100 MG tablet Take 100 mg by mouth daily.   Marland Kitchen LORazepam (ATIVAN) 1 MG tablet Take 1 mg by mouth every 8 (eight) hours.  . nitroGLYCERIN (NITROSTAT) 0.4 MG SL tablet DISSOLVE ONE TABLET UNDER THE TONGUE EVERY 5 MINUTES AS NEEDED FOR CHEST PAIN.  DO NOT EXCEED A TOTAL OF 3 DOSES IN 15 MINUTES  . omeprazole (PRILOSEC) 20 MG capsule Take 1 capsule by mouth once daily  . pregabalin (LYRICA) 150 MG capsule Take 150 mg by mouth 2 (two) times daily.  . rosuvastatin (CRESTOR) 10 MG tablet Take 1 tablet (10 mg total) by mouth daily.  . SUMAtriptan (IMITREX) 100 MG tablet Take 100 mg by mouth every 2 (two) hours as needed for migraine. May repeat in 2 hours if headache persists or recurs.  Marland Kitchen tiZANidine (ZANAFLEX) 4 MG tablet Take 1 tablet by mouth as needed.     Allergies:   Adhesive [tape]   Social History   Socioeconomic History  . Marital status: Divorced    Spouse name: Not on file  . Number of children: Not on file  . Years of education: Not on file  . Highest education level: Not on file  Occupational History  . Not on file  Social Needs  . Financial resource strain: Not on file  . Food insecurity    Worry: Not on file    Inability: Not on file  . Transportation needs    Medical: Not on file    Non-medical: Not on file  Tobacco Use  . Smoking status: Current Every Day Smoker  . Smokeless tobacco: Never Used  . Tobacco comment: Patient stated she quit smoking last February.   Substance and Sexual Activity  . Alcohol use: Not Currently  . Drug use: Not Currently  . Sexual activity: Yes  Lifestyle  . Physical activity    Days per week: Not on file    Minutes per session: Not on file  . Stress: Not on file  Relationships  . Social Musician on phone: Not on file    Gets together: Not on file    Attends religious service: Not on file    Active member of club or organization: Not on file    Attends meetings of clubs or organizations: Not on file    Relationship status: Not on file  Other Topics Concern  .  Not on file  Social History Narrative  . Not on file     Family History: The patient's family history includes Bipolar disorder in her mother and sister; Stroke in her maternal grandfather, maternal grandmother, and sister.  ROS:   Please see the history of present illness.    All other systems reviewed and are negative.  EKGs/Labs/Other  Studies Reviewed:    The following studies were reviewed today: I discussed the findings of the stress test with the patient at length   Recent Labs: 07/23/2018: Hemoglobin 14.1; Platelets 314; TSH 1.460 05/14/2019: ALT 27; BUN 8; Creatinine, Ser 0.89; Potassium 4.3; Sodium 140  Recent Lipid Panel    Component Value Date/Time   CHOL 232 (H) 05/14/2019 1355   TRIG 222 (H) 05/14/2019 1355   HDL 41 05/14/2019 1355   CHOLHDL 5.7 (H) 05/14/2019 1355   CHOLHDL 3.4 CALC 09/24/2006 0916   VLDL 8 09/24/2006 0916   LDLCALC 150 (H) 05/14/2019 1355    Physical Exam:    VS:  BP (!) 168/108   Pulse 96   Ht 5' 4.5" (1.638 m)   Wt 178 lb 9.6 oz (81 kg)   SpO2 97%   BMI 30.18 kg/m     Wt Readings from Last 3 Encounters:  05/26/19 178 lb 9.6 oz (81 kg)  12/09/18 176 lb (79.8 kg)  11/14/18 176 lb (79.8 kg)     GEN: Patient is in no acute distress HEENT: Normal NECK: No JVD; No carotid bruits LYMPHATICS: No lymphadenopathy CARDIAC: S1 S2 regular, 2/6 systolic murmur at the apex. RESPIRATORY:  Clear to auscultation without rales, wheezing or rhonchi  ABDOMEN: Soft, non-tender, non-distended MUSCULOSKELETAL:  No edema; No deformity  SKIN: Warm and dry NEUROLOGIC:  Alert and oriented x 3 PSYCHIATRIC:  Normal affect    Signed, Garwin Brothersajan R Revankar, MD  05/26/2019 3:51 PM    [facility]

## 2019-05-27 ENCOUNTER — Telehealth: Payer: Self-pay | Admitting: Cardiology

## 2019-05-27 NOTE — Telephone Encounter (Signed)
BP is 124/82 but HR is 116. Please advise

## 2019-06-26 ENCOUNTER — Ambulatory Visit: Payer: Medicaid Other | Admitting: Cardiology

## 2019-07-04 ENCOUNTER — Ambulatory Visit (INDEPENDENT_AMBULATORY_CARE_PROVIDER_SITE_OTHER): Payer: Medicaid Other | Admitting: Cardiology

## 2019-07-04 ENCOUNTER — Encounter: Payer: Self-pay | Admitting: Cardiology

## 2019-07-04 ENCOUNTER — Other Ambulatory Visit: Payer: Self-pay

## 2019-07-04 VITALS — BP 132/72 | HR 100 | Ht 64.5 in | Wt 170.8 lb

## 2019-07-04 DIAGNOSIS — R002 Palpitations: Secondary | ICD-10-CM | POA: Diagnosis not present

## 2019-07-04 DIAGNOSIS — F17210 Nicotine dependence, cigarettes, uncomplicated: Secondary | ICD-10-CM

## 2019-07-04 DIAGNOSIS — E782 Mixed hyperlipidemia: Secondary | ICD-10-CM | POA: Diagnosis not present

## 2019-07-04 HISTORY — DX: Nicotine dependence, cigarettes, uncomplicated: F17.210

## 2019-07-04 MED ORDER — DILTIAZEM HCL ER COATED BEADS 120 MG PO CP24: 120.0000 mg | ORAL_CAPSULE | Freq: Every day | ORAL | 1 refills | Status: DC

## 2019-07-04 NOTE — Progress Notes (Signed)
Cardiology Office Note:    Date:  07/04/2019   ID:  Judith Bass, DOB 19-Dec-1965, MRN [id number]  PCP:  Lise Auer, MD  Cardiologist:  Garwin Brothers, MD   Referring MD: Lise Auer, MD    ASSESSMENT:    1. Mixed dyslipidemia   2. Palpitations   3. Cigarette smoker    PLAN:    In order of problems listed above:  1. Primary prevention stressed with the patient.  Importance of compliance with diet and medication stressed and she vocalized understanding. 2. Essential hypertension: Blood pressure stable however in view of elevated heart rate I will switch her from amlodipine to Cardizem CD 120 mg daily.  She will keep a track of her pulse and blood pressures and keep [place] posted. 3. Mixed dyslipidemia: Diet was discussed she will have blood work before her next visit in a month.  At that time I will review and see if she is a candidate for statin therapy. 4. Cigarette smoker: I spent 5 minutes with the patient discussing solely about smoking. Smoking cessation was counseled. I suggested to the patient also different medications and pharmacological interventions. Patient is keen to try stopping on its own at this time. He will get back to me if he needs any further assistance in this matter.   Medication Adjustments/Labs and Tests Ordered: Current medicines are reviewed at length with the patient today.  Concerns regarding medicines are outlined above.  No orders of the defined types were placed in this encounter.  No orders of the defined types were placed in this encounter.    No chief complaint on file.    History of Present Illness:    Judith Bass is a 54 y.o. female.  Patient has past medical history of essential hypertension, palpitations and mixed dyslipidemia.  Unfortunately she continues to smoke.  She tells me that she is doing better with her diet and trying to lose weight.  She does not exercise regularly.  At the time of my evaluation, the patient is  alert awake oriented and in no distress.  Past Medical History:  Diagnosis Date  . ABDOMINAL PAIN 03/09/2008   Qualifier: Diagnosis of  By: Plotnikov MD, Georgina Quint   . Anxiety   . ANXIETY 03/10/2007   Qualifier: Diagnosis of  By: Posey Rea MD, Georgina Quint   . ASTHMA 01/09/2007   Qualifier: Diagnosis of  By: Tora Perches    . CHEST PAIN 09/03/2007   Qualifier: Diagnosis of  By: Jonny Ruiz MD, Len Blalock   . Cough 08/09/2007   Qualifier: Diagnosis of  By: Posey Rea MD, Georgina Quint Depression   . Diarrhea 03/09/2008   Qualifier: Diagnosis of  By: Plotnikov MD, Georgina Quint DISORDER, FUNCTIONAL, INTESTINE NOS 03/04/2007   Qualifier: Diagnosis of  By: Plotnikov MD, Georgina Quint   . GERD 03/10/2007   Qualifier: Diagnosis of  By: Plotnikov MD, Georgina Quint   . Irritable bowel syndrome 06/11/2007   Centricity Description: IRRITABLE BOWEL SYNDROME Qualifier: Diagnosis of  By: Posey Rea MD, Georgina Quint  Centricity Description: IBS Qualifier: Diagnosis of  By: Jonny Ruiz MD, Len Blalock   . LOW BACK PAIN 06/11/2007   Qualifier: Diagnosis of  By: Posey Rea MD, Georgina Quint MIGRAINES, HX OF 01/09/2007   Qualifier: Diagnosis of  By: Tora Perches    . SINUSITIS, ACUTE 10/02/2007   Qualifier: Diagnosis of  By: Plotnikov MD, Georgina Quint UPPER RESPIRATORY  INFECTION (URI) 08/09/2007   Qualifier: Diagnosis of  By: Plotnikov MD, Evie Lacks   . WEIGHT LOSS 03/04/2007   Qualifier: Diagnosis of  By: Plotnikov MD, Evie Lacks     No past surgical history on file.  Current Medications: Current Meds  Medication Sig  . AIMOVIG 70 MG/ML SOAJ Inject 70 mg into the skin every 30 (thirty) days.  Marland Kitchen albuterol (PROVENTIL HFA;VENTOLIN HFA) 108 (90 Base) MCG/ACT inhaler Inhale into the lungs every 6 (six) hours as needed for wheezing or shortness of breath.  Marland Kitchen amLODipine (NORVASC) 5 MG tablet Take 1 tablet (5 mg total) by mouth daily.  Marland Kitchen amphetamine-dextroamphetamine (ADDERALL) 20 MG tablet Take 20 mg by mouth daily.   Marland Kitchen lamoTRIgine  (LAMICTAL) 100 MG tablet Take 100 mg by mouth daily.  Marland Kitchen LORazepam (ATIVAN) 1 MG tablet Take 1 mg by mouth every 8 (eight) hours.  . nitroGLYCERIN (NITROSTAT) 0.4 MG SL tablet DISSOLVE ONE TABLET UNDER THE TONGUE EVERY 5 MINUTES AS NEEDED FOR CHEST PAIN.  DO NOT EXCEED A TOTAL OF 3 DOSES IN 15 MINUTES  . omeprazole (PRILOSEC) 20 MG capsule Take 1 capsule by mouth once daily  . pregabalin (LYRICA) 150 MG capsule Take 150 mg by mouth daily.   . rosuvastatin (CRESTOR) 10 MG tablet Take 1 tablet (10 mg total) by mouth daily.  . SUMAtriptan (IMITREX) 100 MG tablet Take 100 mg by mouth every 2 (two) hours as needed for migraine. May repeat in 2 hours if headache persists or recurs.     Allergies:   Adhesive [tape]   Social History   Socioeconomic History  . Marital status: Divorced    Spouse name: Not on file  . Number of children: Not on file  . Years of education: Not on file  . Highest education level: Not on file  Occupational History  . Not on file  Tobacco Use  . Smoking status: Current Every Day Smoker  . Smokeless tobacco: Never Used  . Tobacco comment: Patient stated she quit smoking last February.   Substance and Sexual Activity  . Alcohol use: Not Currently  . Drug use: Not Currently  . Sexual activity: Yes  Other Topics Concern  . Not on file  Social History Narrative  . Not on file   Social Determinants of Health   Financial Resource Strain:   . Difficulty of Paying Living Expenses: Not on file  Food Insecurity:   . Worried About Charity fundraiser in the Last Year: Not on file  . Ran Out of Food in the Last Year: Not on file  Transportation Needs:   . Lack of Transportation (Medical): Not on file  . Lack of Transportation (Non-Medical): Not on file  Physical Activity:   . Days of Exercise per Week: Not on file  . Minutes of Exercise per Session: Not on file  Stress:   . Feeling of Stress : Not on file  Social Connections:   . Frequency of Communication with  Friends and Family: Not on file  . Frequency of Social Gatherings with Friends and Family: Not on file  . Attends Religious Services: Not on file  . Active Member of Clubs or Organizations: Not on file  . Attends Archivist Meetings: Not on file  . Marital Status: Not on file     Family History: The patient's family history includes Bipolar disorder in her mother and sister; Stroke in her maternal grandfather, maternal grandmother, and sister.  ROS:  Please see the history of present illness.    All other systems reviewed and are negative.  EKGs/Labs/Other Studies Reviewed:    The following studies were reviewed today: I discussed my findings with the patient at length including reports of echocardiogram.   Recent Labs: 07/23/2018: Hemoglobin 14.1; Platelets 314; TSH 1.460 05/14/2019: ALT 27; BUN 8; Creatinine, Ser 0.89; Potassium 4.3; Sodium 140  Recent Lipid Panel    Component Value Date/Time   CHOL 232 (H) 05/14/2019 1355   TRIG 222 (H) 05/14/2019 1355   HDL 41 05/14/2019 1355   CHOLHDL 5.7 (H) 05/14/2019 1355   CHOLHDL 3.4 CALC 09/24/2006 0916   VLDL 8 09/24/2006 0916   LDLCALC 150 (H) 05/14/2019 1355    Physical Exam:    VS:  BP 132/72   Pulse 100   Ht 5' 4.5" (1.638 m)   Wt 170 lb 12.8 oz (77.5 kg)   SpO2 96%   BMI 28.86 kg/m     Wt Readings from Last 3 Encounters:  07/04/19 170 lb 12.8 oz (77.5 kg)  05/26/19 178 lb 9.6 oz (81 kg)  12/09/18 176 lb (79.8 kg)     GEN: Patient is in no acute distress HEENT: Normal NECK: No JVD; No carotid bruits LYMPHATICS: No lymphadenopathy CARDIAC: Hear sounds regular, 2/6 systolic murmur at the apex. RESPIRATORY:  Clear to auscultation without rales, wheezing or rhonchi  ABDOMEN: Soft, non-tender, non-distended MUSCULOSKELETAL:  No edema; No deformity  SKIN: Warm and dry NEUROLOGIC:  Alert and oriented x 3 PSYCHIATRIC:  Normal affect   Signed, Garwin Brothers, MD  07/04/2019 3:16 PM    [facility]

## 2019-07-04 NOTE — Addendum Note (Signed)
Addended by: Fayrene Fearing B on: 07/04/2019 03:38 PM   Modules accepted: Orders

## 2019-07-04 NOTE — Patient Instructions (Signed)
Medication Instructions:  Your physician has recommended you make the following change in your medication:   STOP: Amlodipine  START: CArdizem CD(diltiazem) 120 mg TAke 1 tab daily  *If you need a refill on your cardiac medications before your next appointment, please call your pharmacy*  Lab Work: Your physician recommends that you return for lab work in: BEFORE NEXT VISIT BMP,LIPID,LIVER fasting  If you have labs (blood work) drawn today and your tests are completely normal, you will receive your results only by: Marland Kitchen MyChart Message (if you have MyChart) OR . A paper copy in the mail If you have any lab test that is abnormal or we need to change your treatment, we will call you to review the results.  Testing/Procedures: None  Follow-Up: At [facility], you and your health needs are our priority.  As part of our continuing mission to provide you with exceptional heart care, we have created designated Provider Care Teams.  These Care Teams include your primary Cardiologist (physician) and Advanced Practice Providers (APPs -  Physician Assistants and Nurse Practitioners) who all work together to provide you with the care you need, when you need it.  Your next appointment:   1 month(s)  The format for your next appointment:   In Person  Provider:   Belva Crome, MD  Other Instructions

## 2019-07-30 ENCOUNTER — Other Ambulatory Visit: Payer: Self-pay | Admitting: Family Medicine

## 2019-07-30 DIAGNOSIS — F329 Major depressive disorder, single episode, unspecified: Secondary | ICD-10-CM

## 2019-07-30 DIAGNOSIS — F419 Anxiety disorder, unspecified: Secondary | ICD-10-CM

## 2019-07-31 DIAGNOSIS — M21619 Bunion of unspecified foot: Secondary | ICD-10-CM

## 2019-07-31 DIAGNOSIS — M779 Enthesopathy, unspecified: Secondary | ICD-10-CM

## 2019-07-31 DIAGNOSIS — M7751 Other enthesopathy of right foot: Secondary | ICD-10-CM

## 2019-07-31 DIAGNOSIS — M21612 Bunion of left foot: Secondary | ICD-10-CM | POA: Insufficient documentation

## 2019-07-31 DIAGNOSIS — M21611 Bunion of right foot: Secondary | ICD-10-CM | POA: Insufficient documentation

## 2019-07-31 HISTORY — DX: Enthesopathy, unspecified: M77.9

## 2019-07-31 HISTORY — DX: Other enthesopathy of right foot and ankle: M77.51

## 2019-07-31 HISTORY — DX: Bunion of left foot: M21.612

## 2019-07-31 HISTORY — DX: Bunion of unspecified foot: M21.619

## 2019-07-31 HISTORY — DX: Bunion of right foot: M21.611

## 2019-08-12 ENCOUNTER — Ambulatory Visit: Payer: Medicaid Other | Admitting: Cardiology

## 2019-08-25 ENCOUNTER — Ambulatory Visit: Payer: Medicaid Other | Admitting: Cardiology

## 2019-08-25 DIAGNOSIS — M5481 Occipital neuralgia: Secondary | ICD-10-CM

## 2019-08-25 HISTORY — DX: Occipital neuralgia: M54.81

## 2019-08-27 DIAGNOSIS — F902 Attention-deficit hyperactivity disorder, combined type: Secondary | ICD-10-CM

## 2019-08-27 DIAGNOSIS — F411 Generalized anxiety disorder: Secondary | ICD-10-CM

## 2019-08-27 DIAGNOSIS — R413 Other amnesia: Secondary | ICD-10-CM

## 2019-08-27 DIAGNOSIS — Z720 Tobacco use: Secondary | ICD-10-CM

## 2019-08-27 DIAGNOSIS — F329 Major depressive disorder, single episode, unspecified: Secondary | ICD-10-CM

## 2019-08-27 DIAGNOSIS — N3281 Overactive bladder: Secondary | ICD-10-CM

## 2019-08-27 DIAGNOSIS — R5383 Other fatigue: Secondary | ICD-10-CM

## 2019-08-27 HISTORY — DX: Generalized anxiety disorder: F41.1

## 2019-08-27 HISTORY — DX: Tobacco use: Z72.0

## 2019-08-27 HISTORY — DX: Major depressive disorder, single episode, unspecified: F32.9

## 2019-08-27 HISTORY — DX: Other amnesia: R41.3

## 2019-08-27 HISTORY — DX: Other fatigue: R53.83

## 2019-08-27 HISTORY — DX: Attention-deficit hyperactivity disorder, combined type: F90.2

## 2019-08-27 HISTORY — DX: Overactive bladder: N32.81

## 2019-09-11 ENCOUNTER — Ambulatory Visit: Payer: Medicaid Other | Admitting: Cardiology

## 2019-09-14 ENCOUNTER — Other Ambulatory Visit: Payer: Self-pay | Admitting: Cardiology

## 2019-09-14 ENCOUNTER — Other Ambulatory Visit: Payer: Self-pay | Admitting: Family Medicine

## 2019-10-07 ENCOUNTER — Ambulatory Visit: Payer: Medicaid Other | Admitting: Cardiology

## 2019-10-14 ENCOUNTER — Other Ambulatory Visit: Payer: Self-pay

## 2019-10-14 DIAGNOSIS — G5600 Carpal tunnel syndrome, unspecified upper limb: Secondary | ICD-10-CM

## 2019-10-14 HISTORY — DX: Carpal tunnel syndrome, unspecified upper limb: G56.00

## 2019-10-16 ENCOUNTER — Ambulatory Visit: Payer: Medicaid Other | Admitting: Cardiology

## 2019-11-18 ENCOUNTER — Telehealth: Payer: Self-pay

## 2019-11-18 ENCOUNTER — Other Ambulatory Visit: Payer: Self-pay

## 2019-11-18 ENCOUNTER — Ambulatory Visit: Payer: Medicaid Other | Admitting: Cardiology

## 2019-11-18 MED ORDER — DILTIAZEM HCL ER COATED BEADS 120 MG PO CP24: 120.0000 mg | ORAL_CAPSULE | Freq: Every day | ORAL | 1 refills | Status: DC

## 2019-11-18 NOTE — Telephone Encounter (Signed)
Left message on patients voicemail to please return our call. Patient needs to make an office visit within the next couple of days to follow up with Dr. Tomie China.

## 2019-11-26 ENCOUNTER — Ambulatory Visit: Payer: Medicaid Other | Admitting: Cardiology

## 2019-12-01 ENCOUNTER — Ambulatory Visit: Payer: Medicaid Other | Admitting: Cardiology

## 2020-01-19 DIAGNOSIS — R002 Palpitations: Secondary | ICD-10-CM

## 2020-01-19 DIAGNOSIS — E782 Mixed hyperlipidemia: Secondary | ICD-10-CM

## 2020-01-23 ENCOUNTER — Ambulatory Visit: Payer: Medicaid Other | Admitting: Cardiology

## 2020-01-28 NOTE — Addendum Note (Signed)
Addended by: Eleonore Chiquito on: 01/28/2020 04:26 PM   Modules accepted: Orders

## 2020-01-29 ENCOUNTER — Ambulatory Visit (INDEPENDENT_AMBULATORY_CARE_PROVIDER_SITE_OTHER): Payer: Medicaid Other | Admitting: Cardiology

## 2020-01-29 ENCOUNTER — Encounter: Payer: Self-pay | Admitting: Cardiology

## 2020-01-29 ENCOUNTER — Other Ambulatory Visit: Payer: Self-pay

## 2020-01-29 VITALS — BP 126/74 | HR 102 | Ht 64.5 in | Wt 157.4 lb

## 2020-01-29 DIAGNOSIS — E782 Mixed hyperlipidemia: Secondary | ICD-10-CM

## 2020-01-29 DIAGNOSIS — Z720 Tobacco use: Secondary | ICD-10-CM

## 2020-01-29 LAB — HEPATIC FUNCTION PANEL
ALT: 14 IU/L (ref 0–32)
AST: 15 IU/L (ref 0–40)
Albumin: 4.6 g/dL (ref 3.8–4.9)
Alkaline Phosphatase: 106 IU/L (ref 48–121)
Bilirubin Total: 0.3 mg/dL (ref 0.0–1.2)
Bilirubin, Direct: 0.12 mg/dL (ref 0.00–0.40)
Total Protein: 7 g/dL (ref 6.0–8.5)

## 2020-01-29 LAB — BASIC METABOLIC PANEL
BUN/Creatinine Ratio: 11 (ref 9–23)
BUN: 11 mg/dL (ref 6–24)
CO2: 21 mmol/L (ref 20–29)
Calcium: 9.4 mg/dL (ref 8.7–10.2)
Chloride: 105 mmol/L (ref 96–106)
Creatinine, Ser: 0.98 mg/dL (ref 0.57–1.00)
GFR calc Af Amer: 77 mL/min/{1.73_m2} (ref >59)
GFR calc non Af Amer: 67 mL/min/{1.73_m2} (ref >59)
Glucose: 109 mg/dL — ABNORMAL HIGH (ref 65–99)
Potassium: 4.2 mmol/L (ref 3.5–5.2)
Sodium: 139 mmol/L (ref 134–144)

## 2020-01-29 LAB — CBC WITH DIFFERENTIAL/PLATELET
Basophils Absolute: 0.1 10*3/uL (ref 0.0–0.2)
Basos: 2 %
EOS (ABSOLUTE): 0.3 10*3/uL (ref 0.0–0.4)
Eos: 4 %
Hematocrit: 43.1 % (ref 34.0–46.6)
Hemoglobin: 14.9 g/dL (ref 11.1–15.9)
Immature Grans (Abs): 0 10*3/uL (ref 0.0–0.1)
Immature Granulocytes: 0 %
Lymphocytes Absolute: 2.5 10*3/uL (ref 0.7–3.1)
Lymphs: 38 %
MCH: 32.9 pg (ref 26.6–33.0)
MCHC: 34.6 g/dL (ref 31.5–35.7)
MCV: 95 fL (ref 79–97)
Monocytes Absolute: 0.8 10*3/uL (ref 0.1–0.9)
Monocytes: 11 %
Neutrophils Absolute: 3.1 10*3/uL (ref 1.4–7.0)
Neutrophils: 45 %
Platelets: 306 10*3/uL (ref 150–450)
RBC: 4.53 x10E6/uL (ref 3.77–5.28)
RDW: 12.5 % (ref 11.7–15.4)
WBC: 6.7 10*3/uL (ref 3.4–10.8)

## 2020-01-29 LAB — TSH: TSH: 2.34 u[IU]/mL (ref 0.450–4.500)

## 2020-01-29 LAB — LIPID PANEL
Chol/HDL Ratio: 6.2 ratio — ABNORMAL HIGH (ref 0.0–4.4)
Cholesterol, Total: 265 mg/dL — ABNORMAL HIGH (ref 100–199)
HDL: 43 mg/dL (ref >39)
LDL Chol Calc (NIH): 149 mg/dL — ABNORMAL HIGH (ref 0–99)
Triglycerides: 391 mg/dL — ABNORMAL HIGH (ref 0–149)
VLDL Cholesterol Cal: 73 mg/dL — ABNORMAL HIGH (ref 5–40)

## 2020-01-29 MED ORDER — ROSUVASTATIN CALCIUM 20 MG PO TABS: 20.0000 mg | ORAL_TABLET | Freq: Every day | ORAL | 3 refills | Status: DC

## 2020-01-29 NOTE — Patient Instructions (Signed)
Medication Instructions:  Your physician has recommended you make the following change in your medication:   Increase your rosuvastatin to 20 mg daily.  *If you need a refill on your cardiac medications before your next appointment, please call your pharmacy*   Lab Work: Your physician recommends that you return for lab work in: 6 weeks (03/12/20)   You need to have labs done when you are fasting.  You can come Monday through Friday 8:30 am to 12:00 pm and 1:15 to 4:30. You do not need to make an appointment as the order has already been placed. The labs you are going to have done are  LFT and Lipids.  If you have labs (blood work) drawn today and your tests are completely normal, you will receive your results only by: Marland Kitchen MyChart Message (if you have MyChart) OR . A paper copy in the mail If you have any lab test that is abnormal or we need to change your treatment, we will call you to review the results.   Testing/Procedures: None ordered   Follow-Up: At [facility], you and your health needs are our priority.  As part of our continuing mission to provide you with exceptional heart care, we have created designated Provider Care Teams.  These Care Teams include your primary Cardiologist (physician) and Advanced Practice Providers (APPs -  Physician Assistants and Nurse Practitioners) who all work together to provide you with the care you need, when you need it.  We recommend signing up for the patient portal called "MyChart".  Sign up information is provided on this After Visit Summary.  MyChart is used to connect with patients for Virtual Visits (Telemedicine).  Patients are able to view lab/test results, encounter notes, upcoming appointments, etc.  Non-urgent messages can be sent to your provider as well.   To learn more about what you can do with MyChart, go to [email or web address].    Your next appointment:   6 month(s)  The format for your next appointment:   In  Person  Provider:   Belva Crome, MD   Other Instructions NA
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
## 2020-02-13 NOTE — Telephone Encounter (Signed)
How do you advise?

## 2020-03-11 NOTE — Telephone Encounter (Signed)
How do you advise?

## 2020-04-14 ENCOUNTER — Other Ambulatory Visit: Payer: Self-pay

## 2020-04-14 DIAGNOSIS — G43909 Migraine, unspecified, not intractable, without status migrainosus: Secondary | ICD-10-CM

## 2020-04-14 DIAGNOSIS — F419 Anxiety disorder, unspecified: Secondary | ICD-10-CM | POA: Insufficient documentation

## 2020-04-14 DIAGNOSIS — F32A Depression, unspecified: Secondary | ICD-10-CM | POA: Insufficient documentation

## 2020-04-14 HISTORY — DX: Migraine, unspecified, not intractable, without status migrainosus: G43.909

## 2020-04-16 ENCOUNTER — Ambulatory Visit: Payer: Medicaid Other | Admitting: Cardiology

## 2020-04-26 ENCOUNTER — Other Ambulatory Visit: Payer: Self-pay

## 2020-04-26 MED ORDER — DILTIAZEM HCL ER COATED BEADS 120 MG PO CP24: 120.0000 mg | ORAL_CAPSULE | Freq: Every day | ORAL | 3 refills | Status: DC

## 2020-04-27 NOTE — Telephone Encounter (Signed)
You can also view them in Care Everywhere.

## 2020-05-19 ENCOUNTER — Other Ambulatory Visit: Payer: Self-pay

## 2020-05-20 ENCOUNTER — Ambulatory Visit: Payer: Medicaid Other | Admitting: Cardiology

## 2020-07-21 ENCOUNTER — Other Ambulatory Visit: Payer: Self-pay

## 2020-07-27 ENCOUNTER — Encounter: Payer: Self-pay | Admitting: Cardiology

## 2020-07-27 ENCOUNTER — Ambulatory Visit (INDEPENDENT_AMBULATORY_CARE_PROVIDER_SITE_OTHER): Payer: Medicaid Other | Admitting: Cardiology

## 2020-07-27 ENCOUNTER — Other Ambulatory Visit: Payer: Self-pay

## 2020-07-27 VITALS — BP 122/80 | HR 107 | Ht 64.5 in | Wt 168.8 lb

## 2020-07-27 DIAGNOSIS — F17210 Nicotine dependence, cigarettes, uncomplicated: Secondary | ICD-10-CM

## 2020-07-27 DIAGNOSIS — R002 Palpitations: Secondary | ICD-10-CM | POA: Diagnosis not present

## 2020-07-27 DIAGNOSIS — E782 Mixed hyperlipidemia: Secondary | ICD-10-CM | POA: Diagnosis not present

## 2020-07-27 NOTE — Patient Instructions (Signed)

## 2020-07-27 NOTE — Progress Notes (Signed)
Cardiology Office Note:    Date:  07/27/2020   ID:  Judith Bass, DOB [birth year], MRN [id number]  PCP:  Judith Dimmer, FNP  Cardiologist:  Judith Brothers, MD   Referring MD: Judith Bass *    ASSESSMENT:    1. Palpitations   2. Mixed dyslipidemia   3. Cigarette smoker    PLAN:    In order of problems listed above:  1. Primary prevention stressed with the patient. Importance of compliance with with diet medication stressed and she vocalized understanding. I told her to walk at least half an hour a day on a regular basis and she promises to do the best of her ability. 2. Mixed dyslipidemia: Diet was emphasized and lipids were reviewed and had excellent with medications and diet. I congratulated her about this. I told her to maintain a healthy lifestyle and she is doing well. 3. Cigarette smoker: I spent 5 minutes with the patient discussing solely about smoking. Smoking cessation was counseled. I suggested to the patient also different medications and pharmacological interventions. Patient is keen to try stopping on its own at this time. He will get back to me if he needs any further assistance in this matter. 4. Patient will be seen in follow-up appointment in 6 months or earlier if the patient has any concerns    Medication Adjustments/Labs and Tests Ordered: Current medicines are reviewed at length with the patient today.  Concerns regarding medicines are outlined above.  No orders of the defined types were placed in this encounter.  No orders of the defined types were placed in this encounter.    No chief complaint on file.    History of Present Illness:    Judith Bass is a 55 y.o. female. Patient has past medical history of palpitations mixed dyslipidemia and cigarette smoking. She denies any problems at this time and takes care of activities of daily living. No chest pain orthopnea or PND. At the time of my evaluation, the patient is  alert awake oriented and in no distress. She quit smoking but has unfortunately been seen.  Past Medical History:  Diagnosis Date  . ABDOMINAL PAIN 03/09/2008   Qualifier: Diagnosis of  By: Plotnikov MD, Georgina Quint   . Anxiety   . ANXIETY 03/10/2007   Qualifier: Diagnosis of  By: Plotnikov MD, Georgina Quint   . Anxiety state 03/10/2007   Formatting of this note might be different from the original. Qualifier: Diagnosis of  By: Plotnikov MD, Georgina Quint  . ASTHMA 01/09/2007   Qualifier: Diagnosis of  By: Tora Perches    . Asthma 01/09/2007   Formatting of this note might be different from the original. Qualifier: Diagnosis of  By: Tora Perches  . Attention deficit hyperactivity disorder (ADHD), combined type 08/27/2019  . Bilateral bunions 07/31/2019  . Bilateral occipital neuralgia 08/25/2019   Formatting of this note might be different from the original. Last Assessment & Plan:  Formatting of this note might be different from the original.  55 y.o. female with multiple sources of chronic pain including headaches related to occipital neuralgia. Will proceed today with bilateral occipital nerve blocks. She will return in approximately 4 weeks with Judith Co, PA for further evaluatio  . Bunion 07/31/2019  . Capsulitis 07/31/2019  . Capsulitis of metatarsophalangeal (MTP) joint of right foot 07/31/2019  . Carpal tunnel syndrome 10/14/2019  . Cervicalgia 02/11/2019  . Chest discomfort 11/06/2018  . CHEST PAIN 09/03/2007   Qualifier: Diagnosis of  ByJonny Ruiz MD, Len Blalock   . Chronic bilateral low back pain with bilateral sciatica 02/11/2019  . Chronic nonintractable headache 04/10/2019   Last Assessment & Plan:  Formatting of this note might be different from the original. 55 year old female with fibromyalgia related chronic pain as well as chronic headaches.  She is currently on monthly aimovig with abortive Imitrex, with inadequate relief.  Today we reviewed cervical spine plain films which show mild left  facet arthropathy at C4-5 without foraminal stenosis.  We discussed that h  . Cigarette smoker 07/04/2019  . Cough 08/09/2007   Qualifier: Diagnosis of  By: Plotnikov MD, Georgina Quint   . Depression   . Diarrhea 03/09/2008   Qualifier: Diagnosis of  By: Plotnikov MD, Georgina Quint DISORDER, FUNCTIONAL, INTESTINE NOS 03/04/2007   Qualifier: Diagnosis of  By: Plotnikov MD, Georgina Quint   . Ex-smoker 12/09/2018  . Fatigue 08/27/2019  . Fibromyalgia 02/11/2016   Last Assessment & Plan:  Formatting of this note might be different from the original. Intolerant to Cymbalta.  Recently started on Lexapro and doing well.  Will provide a refill of Lyrica 150 mg b.i.d. Formatting of this note might be different from the original. Last Assessment & Plan:  Formatting of this note might be different from the original. Intolerant to Cymbalta.  Recently started on Lex  . GAD (generalized anxiety disorder) 08/27/2019  . GERD 03/10/2007   Qualifier: Diagnosis of  By: Plotnikov MD, Georgina Quint   . Irritable bowel syndrome 06/11/2007   Centricity Description: IRRITABLE BOWEL SYNDROME Qualifier: Diagnosis of  By: Posey Rea MD, Georgina Quint  Centricity Description: IBS Qualifier: Diagnosis of  By: Jonny Ruiz MD, Len Blalock   . LOW BACK PAIN 06/11/2007   Qualifier: Diagnosis of  By: Posey Rea MD, Georgina Quint Lumbar radiculopathy 02/11/2019  . Memory problem 08/27/2019  . Migraines 04/14/2020  . MIGRAINES, HX OF 01/09/2007   Qualifier: Diagnosis of  By: Tora Perches    . Mixed dyslipidemia 11/06/2018  . Myofascial pain 02/11/2019   Last Assessment & Plan:  Formatting of this note might be different from the original. Today we did order imaging of cervical and lumbar spine to rule out any underlying pathology.  Imaging did return with no significant degenerative changes at either level.  At this time it does appear that patient's pattern of pain would be best addressed with nonnarcotic therapies, we specifically discussed add  . OAB  (overactive bladder) 08/27/2019  . Palpitations 11/06/2018  . Reactive depression 08/27/2019  . SINUSITIS, ACUTE 10/02/2007   Qualifier: Diagnosis of  By: Plotnikov MD, Georgina Quint   . Tobacco use 08/27/2019  . UPPER RESPIRATORY INFECTION (URI) 08/09/2007   Qualifier: Diagnosis of  By: Posey Rea MD, Georgina Quint WEIGHT LOSS 03/04/2007   Qualifier: Diagnosis of  By: Posey Rea MD, Georgina Quint     Past Surgical History:  Procedure Laterality Date  . NO PAST SURGERIES      Current Medications: Current Meds  Medication Sig  . ADDERALL XR 25 MG 24 hr capsule Take 25 mg by mouth every morning.  Marland Kitchen AIMOVIG 140 MG/ML SOAJ Inject 140 mg into the skin every 30 (thirty) days.  Marland Kitchen albuterol (PROVENTIL HFA;VENTOLIN HFA) 108 (90 Base) MCG/ACT inhaler Inhale into the lungs every 6 (six) hours as needed for wheezing or shortness of breath.  . busPIRone (BUSPAR) 15 MG tablet Take 7.5-15 mg by mouth 3 (three) times daily as needed for anxiety.  Marland Kitchen  ciprofloxacin-dexamethasone (CIPRODEX) OTIC suspension Place 4 drops in ear(s) 2 (two) times daily.  Marland Kitchen dicyclomine (BENTYL) 20 MG tablet Take 20 mg by mouth daily.   . ergocalciferol (VITAMIN D2) 1.25 MG (50000 UT) capsule Take 50,000 Units by mouth every 30 (thirty) days.  Marland Kitchen escitalopram (LEXAPRO) 20 MG tablet Take 20 mg by mouth daily.  Marland Kitchen etonogestrel (NEXPLANON) 68 MG IMPL implant Inject 68 mg into the skin once.  . meloxicam (MOBIC) 15 MG tablet Take 15 mg by mouth daily.  . nitroGLYCERIN (NITROSTAT) 0.4 MG SL tablet DISSOLVE ONE TABLET UNDER THE TONGUE EVERY 5 MINS AS NEEDED FOR CHEST PAIN. DO NOT EXCEED A TOTAL OF THREE DOSES IN 15 MINS.  Marland Kitchen omeprazole (PRILOSEC) 40 MG capsule Take 40 mg by mouth daily.  . ondansetron (ZOFRAN-ODT) 4 MG disintegrating tablet Take 4 mg by mouth every 8 (eight) hours as needed for vomiting or nausea.  . pregabalin (LYRICA) 150 MG capsule Take 150 mg by mouth 2 (two) times daily.  . SUMAtriptan (IMITREX) 100 MG tablet Take 100 mg by  mouth as needed for headache.  . valACYclovir (VALTREX) 1000 MG tablet Take 2 tablets by mouth every 12 (twelve) hours as needed. Cold sore outbreaks     Allergies:   Adhesive [tape] and Other   Social History   Socioeconomic History  . Marital status: Divorced    Spouse name: Not on file  . Number of children: Not on file  . Years of education: Not on file  . Highest education level: Not on file  Occupational History  . Not on file  Tobacco Use  . Smoking status: Current Every Day Smoker  . Smokeless tobacco: Never Used  . Tobacco comment: Patient stated she quit smoking last February.   Vaping Use  . Vaping Use: Some days  Substance and Sexual Activity  . Alcohol use: Not Currently  . Drug use: Not Currently  . Sexual activity: Yes  Other Topics Concern  . Not on file  Social History Narrative  . Not on file   Social Determinants of Health   Financial Resource Strain: Not on file  Food Insecurity: Not on file  Transportation Needs: Not on file  Physical Activity: Not on file  Stress: Not on file  Social Connections: Not on file     Family History: The patient's family history includes Bipolar disorder in her mother and sister; Stroke in her maternal grandfather, maternal grandmother, and sister.  ROS:   Please see the history of present illness.    All other systems reviewed and are negative.  EKGs/Labs/Other Studies Reviewed:    The following studies were reviewed today: EKG was reviewed and reveals sinus tachycardia left axis deviation and nonspecific ST-T changes Study Highlights   Nuclear stress EF: 64%.  The left ventricular ejection fraction is normal (55-65%).  There was no ST segment deviation noted during stress.  The study is normal.  This is a low risk study.  Breast attenuation noted in anteroseptal segments.     Recent Labs: 01/28/2020: ALT 14; BUN 11; Creatinine, Ser 0.98; Hemoglobin 14.9; Platelets 306; Potassium 4.2; Sodium 139;  TSH 2.340  Recent Lipid Panel    Component Value Date/Time   CHOL 265 (H) 01/28/2020 1633   TRIG 391 (H) 01/28/2020 1633   HDL 43 01/28/2020 1633   CHOLHDL 6.2 (H) 01/28/2020 1633   CHOLHDL 3.4 CALC 09/24/2006 0916   VLDL 8 09/24/2006 0916   LDLCALC 149 (H) 01/28/2020 1633  Physical Exam:    VS:  BP 122/80   Pulse (!) 107   Ht 5' 4.5" (1.638 m)   Wt 168 lb 12.8 oz (76.6 kg)   SpO2 98%   BMI 28.53 kg/m     Wt Readings from Last 3 Encounters:  07/27/20 168 lb 12.8 oz (76.6 kg)  01/29/20 157 lb 6.4 oz (71.4 kg)  07/04/19 170 lb 12.8 oz (77.5 kg)     GEN: Patient is in no acute distress HEENT: Normal NECK: No JVD; No carotid bruits LYMPHATICS: No lymphadenopathy CARDIAC: Hear sounds regular, 2/6 systolic murmur at the apex. RESPIRATORY:  Clear to auscultation without rales, wheezing or rhonchi  ABDOMEN: Soft, non-tender, non-distended MUSCULOSKELETAL:  No edema; No deformity  SKIN: Warm and dry NEUROLOGIC:  Alert and oriented x 3 PSYCHIATRIC:  Normal affect   Signed, Judith Brothers, MD  07/27/2020 1:20 PM    [facility]

## 2020-11-30 ENCOUNTER — Other Ambulatory Visit: Payer: Self-pay

## 2020-12-03 ENCOUNTER — Ambulatory Visit: Payer: Medicare Other | Admitting: Cardiology

## 2021-01-04 ENCOUNTER — Other Ambulatory Visit: Payer: Self-pay | Admitting: Cardiology

## 2021-01-31 ENCOUNTER — Ambulatory Visit: Payer: Medicare Other | Admitting: Cardiology

## 2021-02-17 DIAGNOSIS — K146 Glossodynia: Secondary | ICD-10-CM

## 2021-02-17 HISTORY — DX: Glossodynia: K14.6

## 2021-02-23 ENCOUNTER — Ambulatory Visit: Payer: Medicare Other | Admitting: Cardiology

## 2021-03-02 NOTE — Telephone Encounter (Signed)
Patient is following up. I offered her the soonest available with Dr. Tomie China, 04/20/21. She declined, insisting she must be seen sooner. Is there anywhere she can be worked in? Please advise.

## 2021-03-21 MED ORDER — NITROGLYCERIN 0.4 MG SL SUBL: 0.4000 mg | SUBLINGUAL_TABLET | SUBLINGUAL | 3 refills | Status: DC | PRN

## 2021-04-19 ENCOUNTER — Other Ambulatory Visit: Payer: Self-pay

## 2021-04-26 ENCOUNTER — Ambulatory Visit: Payer: Medicare Other | Admitting: Cardiology

## 2021-05-27 ENCOUNTER — Encounter: Payer: Self-pay | Admitting: Cardiology

## 2021-06-02 ENCOUNTER — Encounter: Payer: Self-pay | Admitting: Cardiology

## 2021-06-07 ENCOUNTER — Other Ambulatory Visit: Payer: Self-pay

## 2021-06-14 ENCOUNTER — Encounter: Payer: Self-pay | Admitting: Cardiology

## 2021-06-15 ENCOUNTER — Ambulatory Visit: Payer: Medicare Other | Admitting: Cardiology

## 2021-06-16 DIAGNOSIS — M7541 Impingement syndrome of right shoulder: Secondary | ICD-10-CM | POA: Insufficient documentation

## 2021-06-16 DIAGNOSIS — M75101 Unspecified rotator cuff tear or rupture of right shoulder, not specified as traumatic: Secondary | ICD-10-CM

## 2021-06-16 DIAGNOSIS — M75121 Complete rotator cuff tear or rupture of right shoulder, not specified as traumatic: Secondary | ICD-10-CM | POA: Insufficient documentation

## 2021-06-16 HISTORY — DX: Impingement syndrome of right shoulder: M75.41

## 2021-06-16 HISTORY — DX: Complete rotator cuff tear or rupture of right shoulder, not specified as traumatic: M75.121

## 2021-06-16 HISTORY — DX: Unspecified rotator cuff tear or rupture of right shoulder, not specified as traumatic: M75.101

## 2021-07-02 DIAGNOSIS — I4891 Unspecified atrial fibrillation: Secondary | ICD-10-CM

## 2021-07-02 DIAGNOSIS — M5430 Sciatica, unspecified side: Secondary | ICD-10-CM

## 2021-07-02 HISTORY — DX: Unspecified atrial fibrillation: I48.91

## 2021-07-02 HISTORY — DX: Sciatica, unspecified side: M54.30

## 2021-07-20 ENCOUNTER — Other Ambulatory Visit: Payer: Self-pay

## 2021-07-22 ENCOUNTER — Other Ambulatory Visit: Payer: Self-pay

## 2021-07-22 ENCOUNTER — Ambulatory Visit (INDEPENDENT_AMBULATORY_CARE_PROVIDER_SITE_OTHER): Payer: Medicare Other | Admitting: Cardiology

## 2021-07-22 ENCOUNTER — Encounter: Payer: Self-pay | Admitting: Cardiology

## 2021-07-22 VITALS — BP 124/72 | HR 102 | Ht 64.6 in | Wt 158.0 lb

## 2021-07-22 DIAGNOSIS — R002 Palpitations: Secondary | ICD-10-CM | POA: Diagnosis not present

## 2021-07-22 DIAGNOSIS — E782 Mixed hyperlipidemia: Secondary | ICD-10-CM | POA: Diagnosis not present

## 2021-07-22 DIAGNOSIS — Z131 Encounter for screening for diabetes mellitus: Secondary | ICD-10-CM

## 2021-07-22 DIAGNOSIS — F17210 Nicotine dependence, cigarettes, uncomplicated: Secondary | ICD-10-CM | POA: Diagnosis not present

## 2021-07-22 DIAGNOSIS — R5383 Other fatigue: Secondary | ICD-10-CM

## 2021-07-22 MED ORDER — ROSUVASTATIN CALCIUM 20 MG PO TABS: 20.0000 mg | ORAL_TABLET | Freq: Every day | ORAL | 3 refills | Status: DC

## 2021-07-22 NOTE — Patient Instructions (Addendum)
Medication Instructions:  ?Your physician recommends that you continue on your current medications as directed. Please refer to the Current Medication list given to you today.  ?*If you need a refill on your cardiac medications before your next appointment, please call your pharmacy* ? ? ?Lab Work: ?Your physician recommends that you return for lab work in: the next few days. ?You need to have labs done when you are fasting.  You can come Monday through Friday 8:30 am to 12:00 pm and 1:15 to 4:30. You do not need to make an appointment as the order has already been placed. The labs you are going to have done are BMET, CBC, TSH, LFT and Lipids. ? ?If you have labs (blood work) drawn today and your tests are completely normal, you will receive your results only by: ?MyChart Message (if you have MyChart) OR ?A paper copy in the mail ?If you have any lab test that is abnormal or we need to change your treatment, we will call you to review the results. ? ? ?Testing/Procedures: ?None ordered ? ? ?Follow-Up: ?At [facility], you and your health needs are our priority.  As part of our continuing mission to provide you with exceptional heart care, we have created designated Provider Care Teams.  These Care Teams include your primary Cardiologist (physician) and Advanced Practice Providers (APPs -  Physician Assistants and Nurse Practitioners) who all work together to provide you with the care you need, when you need it. ? ?We recommend signing up for the patient portal called "MyChart".  Sign up information is provided on this After Visit Summary.  MyChart is used to connect with patients for Virtual Visits (Telemedicine).  Patients are able to view lab/test results, encounter notes, upcoming appointments, etc.  Non-urgent messages can be sent to your provider as well.   ?To learn more about what you can do with MyChart, go to [email or web address].   ? ?Your next appointment:   ?9 month(s) ? ?The format for your next  appointment:   ?In Person ? ?Provider:   ?Rajan Revankar, MD ? ? ?Other Instructions ?NA  ?

## 2021-07-22 NOTE — Progress Notes (Signed)
Cardiology Office Note:    Date:  07/22/2021   ID:  Judith Bass, DOB Mar 26, 1966, MRN [id number]  PCP:  Montez Hageman, DO  Cardiologist:  Garwin Brothers, MD   Referring MD: Montez Hageman, DO    ASSESSMENT:    1. Cigarette smoker   2. Palpitations   3. Mixed dyslipidemia    PLAN:    In order of problems listed above:  Primary prevention stressed with the patient.  Importance of compliance with diet medication stressed and she vocalized understanding. Mixed dyslipidemia: Diet was emphasized.  Lipids were reviewed.  I told her about lifestyle modification and walking at least half an hour a day 5 days a week and she promises to do so. Cigarette smoker: I spent 5 minutes with the patient discussing solely about smoking. Smoking cessation was counseled. I suggested to the patient also different medications and pharmacological interventions. Patient is keen to try stopping on its own at this time. He will get back to me if he needs any further assistance in this matter. Palpitations: Patient is on calcium channel blocker Cardizem and this has helped her significantly.  She barely feels any palpitations.  She mentions to me that her heart rates are in the range of 70-80 at home. Patient will be seen in follow-up appointment in 9 months or earlier if the patient has any concerns    Medication Adjustments/Labs and Tests Ordered: Current medicines are reviewed at length with the patient today.  Concerns regarding medicines are outlined above.  No orders of the defined types were placed in this encounter.  No orders of the defined types were placed in this encounter.    No chief complaint on file.    History of Present Illness:    Judith Bass is a 56 y.o. female.  Patient has past medical history of mixed dyslipidemia, palpitations and smoking.  She denies any problems at this time.  She leads a sedentary lifestyle.  No chest pain orthopnea or PND.  At the time of my  evaluation, the patient is alert awake oriented and in no distress.  Past Medical History:  Diagnosis Date   ABDOMINAL PAIN 03/09/2008   Qualifier: Diagnosis of  By: Posey Rea MD, Georgina Quint    Anxiety    ANXIETY 03/10/2007   Qualifier: Diagnosis of  By: Posey Rea MD, Georgina Quint    Anxiety state 03/10/2007   Formatting of this note might be different from the original. Qualifier: Diagnosis of  By: Plotnikov MD, Georgina Quint   ASTHMA 01/09/2007   Qualifier: Diagnosis of  By: Tora Perches     Asthma 01/09/2007   Formatting of this note might be different from the original. Qualifier: Diagnosis of  By: Tora Perches   Attention deficit hyperactivity disorder (ADHD), combined type 08/27/2019   Bilateral bunions 07/31/2019   Bilateral occipital neuralgia 08/25/2019   Formatting of this note might be different from the original. Last Assessment & Plan:  Formatting of this note might be different from the original.  56 y.o. female with multiple sources of chronic pain including headaches related to occipital neuralgia. Will proceed today with bilateral occipital nerve blocks. She will return in approximately 4 weeks with Georgian Co, PA for further evaluatio   Bunion 07/31/2019   Capsulitis 07/31/2019   Capsulitis of metatarsophalangeal (MTP) joint of right foot 07/31/2019   Carpal tunnel syndrome 10/14/2019   Cervicalgia 02/11/2019   Chest discomfort 11/06/2018   CHEST PAIN 09/03/2007   Qualifier:  Diagnosis of  By: Jonny Ruiz MD, Len Blalock    Chronic bilateral low back pain with bilateral sciatica 02/11/2019   Chronic nonintractable headache 04/10/2019   Last Assessment & Plan:  Formatting of this note might be different from the original. 56 year old female with fibromyalgia related chronic pain as well as chronic headaches.  She is currently on monthly aimovig with abortive Imitrex, with inadequate relief.  Today we reviewed cervical spine plain films which show mild left facet arthropathy at C4-5 without  foraminal stenosis.  We discussed that h   Cigarette smoker 07/04/2019   Cough 08/09/2007   Qualifier: Diagnosis of  By: Plotnikov MD, Georgina Quint    Depression    Diarrhea 03/09/2008   Qualifier: Diagnosis of  By: Posey Rea MD, Georgina Quint    DISORDER, FUNCTIONAL, INTESTINE NOS 03/04/2007   Qualifier: Diagnosis of  By: Posey Rea MD, Georgina Quint    Ex-smoker 12/09/2018   Fatigue 08/27/2019   Fibromyalgia 02/11/2016   Last Assessment & Plan:  Formatting of this note might be different from the original. Intolerant to Cymbalta.  Recently started on Lexapro and doing well.  Will provide a refill of Lyrica 150 mg b.i.d. Formatting of this note might be different from the original. Last Assessment & Plan:  Formatting of this note might be different from the original. Intolerant to Cymbalta.  Recently started on Lex   GAD (generalized anxiety disorder) 08/27/2019   GERD 03/10/2007   Qualifier: Diagnosis of  By: Plotnikov MD, Georgina Quint    Impingement syndrome of right shoulder 06/16/2021   Formatting of this note might be different from the original. Added automatically from request for surgery [id number]   Irritable bowel syndrome 06/11/2007   Centricity Description: IRRITABLE BOWEL SYNDROME Qualifier: Diagnosis of  By: Posey Rea MD, Georgina Quint  Centricity Description: IBS Qualifier: Diagnosis of  By: Jonny Ruiz MD, Len Blalock    LOW BACK PAIN 06/11/2007   Qualifier: Diagnosis of  By: Posey Rea MD, Georgina Quint    Lumbar radiculopathy 02/11/2019   Memory problem 08/27/2019   Migraines 04/14/2020   MIGRAINES, HX OF 01/09/2007   Qualifier: Diagnosis of  By: Tora Perches     Mixed dyslipidemia 11/06/2018   Myofascial pain 02/11/2019   Last Assessment & Plan:  Formatting of this note might be different from the original. Today we did order imaging of cervical and lumbar spine to rule out any underlying pathology.  Imaging did return with no significant degenerative changes at either level.  At this time it does appear that  patient's pattern of pain would be best addressed with nonnarcotic therapies, we specifically discussed add   Nontraumatic complete tear of right rotator cuff 06/16/2021   Formatting of this note might be different from the original. Added automatically from request for surgery [id number]   OAB (overactive bladder) 08/27/2019   Palpitations 11/06/2018   Reactive depression 08/27/2019   SINUSITIS, ACUTE 10/02/2007   Qualifier: Diagnosis of  By: Plotnikov MD, Georgina Quint    Tear of right rotator cuff 06/16/2021   Formatting of this note might be different from the original. Added automatically from request for surgery [id number]   Tobacco use 08/27/2019   UPPER RESPIRATORY INFECTION (URI) 08/09/2007   Qualifier: Diagnosis of  By: Posey Rea MD, Georgina Quint    WEIGHT LOSS 03/04/2007   Qualifier: Diagnosis of  By: Posey Rea MD, Georgina Quint     Past Surgical History:  Procedure Laterality Date   NO PAST SURGERIES      Current  Medications: No outpatient medications have been marked as taking for the 07/22/21 encounter (Office Visit) with Lucita Montoya, Aundra Dubinajan R, MD.     Allergies:   Adhesive [tape] and Other   Social History   Socioeconomic History   Marital status: Divorced    Spouse name: Not on file   Number of children: Not on file   Years of education: Not on file   Highest education level: Not on file  Occupational History   Not on file  Tobacco Use   Smoking status: Every Day   Smokeless tobacco: Never   Tobacco comments:    Patient stated she quit smoking last February.   Vaping Use   Vaping Use: Some days  Substance and Sexual Activity   Alcohol use: Not Currently   Drug use: Not Currently   Sexual activity: Yes  Other Topics Concern   Not on file  Social History Narrative   Not on file   Social Determinants of Health   Financial Resource Strain: Not on file  Food Insecurity: Not on file  Transportation Needs: Not on file  Physical Activity: Not on file  Stress: Not on file   Social Connections: Not on file     Family History: The patient's family history includes Bipolar disorder in her mother and sister; Stroke in her maternal grandfather, maternal grandmother, and sister.  ROS:   Please see the history of present illness.    All other systems reviewed and are negative.  EKGs/Labs/Other Studies Reviewed:    The following studies were reviewed today: I discussed my findings with the patient at length.   Recent Labs: No results found for requested labs within last 8760 hours.  Recent Lipid Panel    Component Value Date/Time   CHOL 265 (H) 01/28/2020 1633   TRIG 391 (H) 01/28/2020 1633   HDL 43 01/28/2020 1633   CHOLHDL 6.2 (H) 01/28/2020 1633   CHOLHDL 3.4 CALC 09/24/2006 0916   VLDL 8 09/24/2006 0916   LDLCALC 149 (H) 01/28/2020 1633    Physical Exam:    VS:  BP 124/72    Pulse (!) 102    Ht 5' 4.6" (1.641 m)    Wt 158 lb (71.7 kg)    SpO2 96%    BMI 26.62 kg/m     Wt Readings from Last 3 Encounters:  07/22/21 158 lb (71.7 kg)  07/27/20 168 lb 12.8 oz (76.6 kg)  01/29/20 157 lb 6.4 oz (71.4 kg)     GEN: Patient is in no acute distress HEENT: Normal NECK: No JVD; No carotid bruits LYMPHATICS: No lymphadenopathy CARDIAC: Hear sounds regular, 2/6 systolic murmur at the apex. RESPIRATORY:  Clear to auscultation without rales, wheezing or rhonchi  ABDOMEN: Soft, non-tender, non-distended MUSCULOSKELETAL:  No edema; No deformity  SKIN: Warm and dry NEUROLOGIC:  Alert and oriented x 3 PSYCHIATRIC:  Normal affect   Signed, Garwin Brothersajan R Claudia Greenley, MD  07/22/2021 3:00 PM    [facility]

## 2021-08-04 ENCOUNTER — Encounter: Payer: Self-pay | Admitting: Cardiology

## 2021-08-28 ENCOUNTER — Encounter: Payer: Self-pay | Admitting: Cardiology

## 2021-08-28 DIAGNOSIS — R5383 Other fatigue: Secondary | ICD-10-CM

## 2021-08-28 DIAGNOSIS — E782 Mixed hyperlipidemia: Secondary | ICD-10-CM

## 2021-08-28 DIAGNOSIS — R002 Palpitations: Secondary | ICD-10-CM

## 2021-08-29 MED ORDER — ROSUVASTATIN CALCIUM 20 MG PO TABS: 20.0000 mg | ORAL_TABLET | Freq: Every day | ORAL | 3 refills | Status: DC

## 2021-08-29 MED ORDER — DILTIAZEM HCL ER COATED BEADS 120 MG PO CP24: 120.0000 mg | ORAL_CAPSULE | Freq: Every day | ORAL | 3 refills | Status: DC

## 2021-09-05 ENCOUNTER — Encounter: Payer: Self-pay | Admitting: Cardiology

## 2021-09-06 ENCOUNTER — Encounter: Payer: Self-pay | Admitting: Cardiology

## 2021-09-07 ENCOUNTER — Other Ambulatory Visit: Payer: Self-pay

## 2021-09-07 ENCOUNTER — Ambulatory Visit: Payer: Medicare Other

## 2021-09-07 DIAGNOSIS — R002 Palpitations: Secondary | ICD-10-CM

## 2021-09-26 ENCOUNTER — Other Ambulatory Visit (HOSPITAL_BASED_OUTPATIENT_CLINIC_OR_DEPARTMENT_OTHER): Payer: Self-pay | Admitting: Family Medicine

## 2021-09-26 DIAGNOSIS — Z1231 Encounter for screening mammogram for malignant neoplasm of breast: Secondary | ICD-10-CM

## 2021-09-27 ENCOUNTER — Telehealth: Payer: Medicare Other

## 2021-09-30 ENCOUNTER — Inpatient Hospital Stay (HOSPITAL_BASED_OUTPATIENT_CLINIC_OR_DEPARTMENT_OTHER): Admission: RE | Admit: 2021-09-30 | Payer: Medicare Other | Source: Ambulatory Visit | Admitting: Radiology

## 2021-09-30 DIAGNOSIS — Z1231 Encounter for screening mammogram for malignant neoplasm of breast: Secondary | ICD-10-CM

## 2021-11-04 ENCOUNTER — Ambulatory Visit (INDEPENDENT_AMBULATORY_CARE_PROVIDER_SITE_OTHER): Payer: Medicare Other | Admitting: Psychiatry

## 2021-11-04 DIAGNOSIS — F419 Anxiety disorder, unspecified: Secondary | ICD-10-CM

## 2021-11-04 NOTE — Progress Notes (Signed)
Patient contacted at the time of appointment reporting that was a family emergency and could not complete todays appointment.

## 2022-01-29 ENCOUNTER — Encounter: Payer: Self-pay | Admitting: Cardiology

## 2022-01-30 MED ORDER — DILTIAZEM HCL ER COATED BEADS 120 MG PO CP24: 120.0000 mg | ORAL_CAPSULE | Freq: Every day | ORAL | 1 refills | Status: DC

## 2022-02-09 ENCOUNTER — Ambulatory Visit: Payer: Medicare Other | Admitting: Cardiology

## 2022-02-21 DIAGNOSIS — K279 Peptic ulcer, site unspecified, unspecified as acute or chronic, without hemorrhage or perforation: Secondary | ICD-10-CM | POA: Insufficient documentation

## 2022-02-21 HISTORY — DX: Peptic ulcer, site unspecified, unspecified as acute or chronic, without hemorrhage or perforation: K27.9

## 2022-04-05 DIAGNOSIS — F310 Bipolar disorder, current episode hypomanic: Secondary | ICD-10-CM

## 2022-04-05 DIAGNOSIS — F319 Bipolar disorder, unspecified: Secondary | ICD-10-CM | POA: Insufficient documentation

## 2022-04-05 HISTORY — DX: Bipolar disorder, current episode hypomanic: F31.0

## 2022-04-05 HISTORY — DX: Bipolar disorder, unspecified: F31.9

## 2022-05-01 ENCOUNTER — Ambulatory Visit: Payer: Medicare Other | Admitting: Cardiology

## 2022-06-13 ENCOUNTER — Ambulatory Visit: Payer: Medicare Other | Attending: Cardiology | Admitting: Cardiology

## 2022-06-14 ENCOUNTER — Encounter: Payer: Self-pay | Admitting: Cardiology

## 2022-06-22 ENCOUNTER — Ambulatory Visit: Payer: Medicare Other | Admitting: Cardiology

## 2022-06-29 NOTE — Telephone Encounter (Signed)
This patient will be a new patient - never evaluated by me in the past .

## 2022-07-06 ENCOUNTER — Encounter: Payer: Self-pay | Admitting: Cardiology

## 2022-07-10 ENCOUNTER — Other Ambulatory Visit: Payer: Self-pay

## 2022-07-10 ENCOUNTER — Ambulatory Visit: Payer: Medicare Other | Attending: Cardiology | Admitting: Cardiology

## 2022-07-11 ENCOUNTER — Encounter: Payer: Self-pay | Admitting: Cardiology

## 2022-07-11 NOTE — Telephone Encounter (Signed)
Pt was made and appt and emailed and mailed new patient paperwork. Pt was told to arrive at 10:00 for a 10:30 appt. Pt was sent a reply to this message.

## 2022-08-21 ENCOUNTER — Other Ambulatory Visit: Payer: Self-pay | Admitting: Cardiology

## 2022-08-28 NOTE — Telephone Encounter (Signed)
Thank you :)

## 2022-09-01 ENCOUNTER — Encounter: Payer: Self-pay | Admitting: Psychiatry

## 2022-09-01 ENCOUNTER — Other Ambulatory Visit: Payer: Self-pay | Admitting: Psychiatry

## 2022-09-01 ENCOUNTER — Ambulatory Visit (INDEPENDENT_AMBULATORY_CARE_PROVIDER_SITE_OTHER): Payer: Medicare Other | Admitting: Psychiatry

## 2022-09-01 VITALS — BP 133/80 | HR 106 | Temp 98.5°F | Ht 64.6 in | Wt 158.2 lb

## 2022-09-01 DIAGNOSIS — Z9189 Other specified personal risk factors, not elsewhere classified: Secondary | ICD-10-CM

## 2022-09-01 DIAGNOSIS — F319 Bipolar disorder, unspecified: Secondary | ICD-10-CM

## 2022-09-01 DIAGNOSIS — F411 Generalized anxiety disorder: Secondary | ICD-10-CM

## 2022-09-01 DIAGNOSIS — Z79899 Other long term (current) drug therapy: Secondary | ICD-10-CM

## 2022-09-01 DIAGNOSIS — F4310 Post-traumatic stress disorder, unspecified: Secondary | ICD-10-CM

## 2022-09-01 DIAGNOSIS — G4710 Hypersomnia, unspecified: Secondary | ICD-10-CM

## 2022-09-01 HISTORY — DX: Other long term (current) drug therapy: Z79.899

## 2022-09-01 HISTORY — DX: Hypersomnia, unspecified: G47.10

## 2022-09-01 HISTORY — DX: Post-traumatic stress disorder, unspecified: F43.10

## 2022-09-01 HISTORY — DX: Other specified personal risk factors, not elsewhere classified: Z91.89

## 2022-09-01 MED ORDER — LAMOTRIGINE 25 MG PO TABS: 25.0000 mg | ORAL_TABLET | Freq: Every day | ORAL | 1 refills | Status: DC
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
## 2022-09-01 NOTE — Patient Instructions (Addendum)
[email or web address]  [email or web address]  [email or web address] [address] [place], [place] [address]  [phone]  [facility], [email or web address] [address] [place], [place] [address]   [phone]   [facility] - [id number]  [facility] - [id number]  [facility] - [place] [phone]  [facility] [phone]    Please call for EKG - [phone]   Quetiapine Tablets What is this medication? QUETIAPINE (kwe TYE a peen) treats schizophrenia and bipolar disorder. It works by balancing the levels of dopamine and serotonin in your brain, hormones that help regulate mood, behaviors, and thoughts. It belongs to a group of medications called antipsychotics. Antipsychotic medications can be used to treat several kinds of mental health conditions. This medicine may be used for other purposes; ask your health care provider or pharmacist if you have questions. COMMON BRAND NAME(S): Seroquel What should I tell my care team before I take this medication? They need to know if you have any of these conditions: Blockage in your bowels Cataracts Constipation Dementia Diabetes Difficulty swallowing Glaucoma Heart disease High levels of prolactin History of breast cancer History of irregular heartbeat Liver disease Low blood cell levels (white cells, red cells, and platelets) Low blood pressure Parkinson disease Prostate disease Seizures Suicidal thoughts, plans, or attempt by you or a family member Thyroid disease Trouble passing urine An unusual or allergic reaction to quetiapine, other medications, foods, dyes, or preservatives Pregnant or trying to get pregnant Breastfeeding How should I use this medication? Take this medication by mouth with water. Take it as directed on the prescription label at the same time every day.  You can take it with or without food. If it upsets your stomach, take it with food. Keep taking it unless your care team tells you to stop. A special MedGuide will be given to you by the pharmacist with each prescription and refill. Be sure to read this information carefully each time. Talk to your care team about the use of this medication in children. While this medication may be prescribed for children as young as 10 years for selected conditions, precautions do apply. People over 52 years of age may have a stronger reaction to this medication and need smaller doses. Overdosage: If you think you have taken too much of this medicine contact a poison control center or emergency room at once. NOTE: This medicine is only for you. Do not share this medicine with others. What if I miss a dose? If you miss a dose, take it as soon as you can. If it is almost time for your next dose, take only that dose. Do not take double or extra doses. What may interact with this medication? Do not take this medication with any of the following: Cisapride Dronedarone Metoclopramide Pimozide Thioridazine This medication may also interact with the following: Alcohol Antihistamines for allergy, cough, and cold Atropine Avasimibe Certain antivirals for HIV or hepatitis Certain medications for anxiety or sleep Certain medications for bladder problems, such as oxybutynin, tolterodine Certain medications for depression, such as amitriptyline, fluoxetine, nefazodone, sertraline Certain medications for fungal infections, such as fluconazole, ketoconazole, itraconazole, posaconazole Certain medications for stomach problems, such as dicyclomine, hyoscyamine Certain medications for travel sickness, such as scopolamine Cimetidine General anesthetics, such as halothane, isoflurane, methoxyflurane, propofol Ipratropium Levodopa or other medications for Parkinson disease Medications for blood pressure Medications for  seizures Medications that relax  muscles for surgery Opioid medications for pain Other medications that cause heart rhythm changes Phenothiazines, such as chlorpromazine, prochlorperazine Rifampin St. John's wort This list may not describe all possible interactions. Give your health care provider a list of all the medicines, herbs, non-prescription drugs, or dietary supplements you use. Also tell them if you smoke, drink alcohol, or use illegal drugs. Some items may interact with your medicine. What should I watch for while using this medication? Visit your care team for regular checks on your progress. Tell your care team if your symptoms do not start to get better or if they get worse. Do not suddenly stop taking This medication. You may develop a severe reaction. Your care team will tell you how much medication to take. If your care team wants you to stop the medication, the dose may be slowly lowered over time to avoid any side effects. You may need to have an eye exam before and during use of this medication. This medication may increase blood sugar. Ask your care team if changes in diet or medications are needed if you have diabetes. This medication may cause thoughts of suicide or depression. This includes sudden changes in mood, behaviors, or thoughts. These changes can happen at any time but are more common in the beginning of treatment or after a change in dose. Call your care team right away if you experience these thoughts or worsening depression. This medication may affect your coordination, reaction time, or judgment. Do not drive or operate machinery until you know how this medication affects you. Sit up or stand slowly to reduce the risk of dizzy or fainting spells. Drinking alcohol with this medication can increase the risk of these side effects. This medication can cause problems with controlling your body temperature. It can lower the response of your body to cold temperatures. If  possible, stay indoors during cold weather. If you must go outdoors, wear warm clothes. It can also lower the response of your body to heat. Do not overheat. Do not over-exercise. Stay out of the sun when possible. If you must be in the sun, wear cool clothing. Drink plenty of water. If you have trouble controlling your body temperature, call your care team right away. What side effects may I notice from receiving this medication? Side effects that you should report to your care team as soon as possible: Allergic reactions--skin rash, itching, hives, swelling of the face, lips, tongue, or throat Heart rhythm changes--fast or irregular heartbeat, dizziness, feeling faint or lightheaded, chest pain, trouble breathing High blood sugar (hyperglycemia)--increased thirst or amount of urine, unusual weakness or fatigue, blurry vision High fever, stiff muscles, increased sweating, fast or irregular heartbeat, and confusion, which may be signs of neuroleptic malignant syndrome High prolactin level--unexpected breast tissue growth, discharge from the nipple, change in sex drive or performance, irregular menstrual cycle Increase in blood pressure in children Infection--fever, chills, cough, or sore throat Low blood pressure--dizziness, feeling faint or lightheaded, blurry vision Low thyroid levels (hypothyroidism)--unusual weakness or fatigue, increased sensitivity to cold, constipation, hair loss, dry skin, weight gain, feelings of depression Pain or trouble swallowing Seizures Stroke--sudden numbness or weakness of the face, arm, or leg, trouble speaking, confusion, trouble walking, loss of balance or coordination, dizziness, severe headache, change in vision Sudden eye pain or change in vision such as blurry vision, seeing halos around lights, vision loss Thoughts of suicide or self-harm, worsening mood, feelings of depression Trouble passing urine Uncontrolled and repetitive body movements, muscle  stiffness or spasms, tremors or shaking, loss of balance or coordination, restlessness, shuffling walk, which may be signs of extrapyramidal symptoms (EPS) Side effects that usually do not require medical attention (report to your care team if they continue or are bothersome): Constipation Dizziness Drowsiness Dry mouth Weight gain This list may not describe all possible side effects. Call your doctor for medical advice about side effects. You may report side effects to FDA at 1-800-FDA-1088. Where should I keep my medication? Keep out of the reach of children. Store at room temperature between 15 and 30 degrees C (59 and 86 degrees F). Throw away any unused medication after the expiration date. NOTE: This sheet is a summary. It may not cover all possible information. If you have questions about this medicine, talk to your doctor, pharmacist, or health care provider.  2023 Elsevier/Gold Standard (2021-12-19 00:00:00)

## 2022-09-02 LAB — MED LIST OPTION NOT SELECTED

## 2022-09-04 LAB — SPECIMEN STATUS REPORT

## 2022-09-05 LAB — BUN+CREAT
BUN/Creatinine Ratio: 14 (ref 9–23)
BUN: 14 mg/dL (ref 6–24)
Creatinine, Ser: 0.98 mg/dL (ref 0.57–1.00)
eGFR: 69 mL/min/{1.73_m2} (ref >59)

## 2022-09-05 LAB — SPECIMEN STATUS REPORT

## 2022-09-07 ENCOUNTER — Telehealth: Payer: Self-pay | Admitting: Psychiatry

## 2022-09-07 LAB — LIPID PANEL W/O CHOL/HDL RATIO
Cholesterol, Total: 151 mg/dL (ref 100–199)
HDL: 66 mg/dL (ref >39)
LDL Chol Calc (NIH): 72 mg/dL (ref 0–99)
Triglycerides: 67 mg/dL (ref 0–149)
VLDL Cholesterol Cal: 13 mg/dL (ref 5–40)

## 2022-09-07 LAB — TSH: TSH: 1.11 u[IU]/mL (ref 0.450–4.500)

## 2022-09-07 LAB — HEPATIC FUNCTION PANEL
ALT: 14 IU/L (ref 0–32)
AST: 18 IU/L (ref 0–40)
Albumin: 4.7 g/dL (ref 3.8–4.9)
Alkaline Phosphatase: 108 IU/L (ref 44–121)
Bilirubin Total: 0.5 mg/dL (ref 0.0–1.2)
Bilirubin, Direct: 0.14 mg/dL (ref 0.00–0.40)
Total Protein: 6.9 g/dL (ref 6.0–8.5)

## 2022-09-07 LAB — MICROALBUMIN / CREATININE URINE RATIO
Creatinine, Urine: 143.5 mg/dL
Microalb/Creat Ratio: 5 mg/g creat (ref 0–29)
Microalbumin, Urine: 7.6 ug/mL

## 2022-09-07 LAB — HGB A1C W/O EAG: Hgb A1c MFr Bld: 5.8 % — ABNORMAL HIGH (ref 4.8–5.6)

## 2022-09-07 LAB — COMPREHENSIVE DRUG ANALYSIS,UR

## 2022-09-07 LAB — PLATELET COUNT: Platelets: 274 10*3/uL (ref 150–450)

## 2022-09-07 NOTE — Telephone Encounter (Signed)
Reviewed labs dated 09/01/2022-reported 09/07/2022  Urine drug screen- Amphetamine-more than 7463 ng/mg.  pregablin-present Lamotrigine-present Cyclobenzaprine-present Celexa-present Acetaminophen-present Diltiazem-present  Lipid panel-within normal limits  Hepatic function panel-within normal limits  Albumin/creatinine-within normal limits.  Hemoglobin A1c-5.8-slightly high  Platelet count-within normal limits.

## 2022-09-11 ENCOUNTER — Telehealth: Payer: Self-pay | Admitting: Psychiatry

## 2022-09-11 NOTE — Telephone Encounter (Signed)
'  I have my meds list from 2019. I will get copies. I found where Dr Geryl Rankins prescribed the 5mg  Flexeril to me back in 2019. I had quite a few left so I have been taking at night for my neck pain. I actually have quite a list of meds I have been on. Will bring at my next visit. I have been on the Buspar for a while as well, the Lorazepam 1 mg seemed to help more. I believe I may have started that back in 2019 per my meds list. '  Received this above message from patient - will have staff contact patient.

## 2022-09-11 NOTE — Telephone Encounter (Signed)
left message with direction and also reminded patient to have EKG done. and give our office a call back.

## 2022-09-13 ENCOUNTER — Telehealth: Payer: Self-pay

## 2022-09-13 NOTE — Telephone Encounter (Signed)
Lab order faxed to [facility] for patient confrimation received.

## 2022-09-14 ENCOUNTER — Encounter: Payer: Self-pay | Admitting: Cardiology

## 2022-09-14 ENCOUNTER — Other Ambulatory Visit: Payer: Self-pay | Admitting: Cardiology

## 2022-09-14 ENCOUNTER — Encounter (INDEPENDENT_AMBULATORY_CARE_PROVIDER_SITE_OTHER): Payer: Medicare Other

## 2022-09-14 DIAGNOSIS — F319 Bipolar disorder, unspecified: Secondary | ICD-10-CM | POA: Diagnosis not present

## 2022-09-14 NOTE — Telephone Encounter (Signed)
Patient with multiple concerns, will have staff contact patient in response to multiple my chart messages sent out on 09/12/22 , 09/13/22. With regards to patient's concern about hypersomnia, patient referred to pulmonology to rule out other causes of fatigue including sleep apnea although she does have fibromyalgia which I am aware of.  This was already discussed with patient at her visit.  With regards to medications I do not recommend lorazepam.  However further medication changes will be made once we receive EKG report.  I have not received it yet.  Will have staff discuss with patient to have a sooner appointment, she can be placed on a wait list, if she continues to have multiple concerns or need medication management.

## 2022-09-15 ENCOUNTER — Other Ambulatory Visit: Payer: Self-pay

## 2022-09-15 MED ORDER — DILTIAZEM HCL ER COATED BEADS 120 MG PO CP24: 120.0000 mg | ORAL_CAPSULE | Freq: Every day | ORAL | 0 refills | Status: DC

## 2022-09-18 ENCOUNTER — Encounter: Payer: Self-pay | Admitting: Cardiology

## 2022-09-18 MED ORDER — QUETIAPINE FUMARATE 25 MG PO TABS: 25.0000 mg | ORAL_TABLET | Freq: Every day | ORAL | 0 refills | Status: DC

## 2022-09-18 NOTE — Telephone Encounter (Signed)
Attempted to contact patient as noted by CMA.

## 2022-09-18 NOTE — Telephone Encounter (Signed)
Called patient on 3/29 and also on 4/1 no answer left voicemail's  each time to return call to office.

## 2022-09-18 NOTE — Telephone Encounter (Signed)
I have reviewed EKG.  We will start Seroquel, 25 mg at bedtime.   I have spent at least 6 minutes non face to face with patient today.

## 2022-09-18 NOTE — Telephone Encounter (Signed)
Dr. Eappen's pt

## 2022-09-19 ENCOUNTER — Encounter: Payer: Self-pay | Admitting: Cardiology

## 2022-09-19 ENCOUNTER — Institutional Professional Consult (permissible substitution): Payer: Medicare Other | Admitting: Primary Care

## 2022-09-19 NOTE — Progress Notes (Deleted)
@Patient  ID: Judith Bass, female    DOB: 1965-09-16, 57 y.o.   MRN: [id number]  No chief complaint on file.   Referring provider: Ursula Alert, MD  HPI: 57 year old female, current every day smoker. PMH significant for asthma, IBD, GERD, peptic ulcer, chronic back pain, ADHA, bipolar disorder, PTSD, GAD, fibromyalgia, hypersomnia.   09/19/2022 Patient presents today for sleep consult due to hypersomnia. She follows with psychiatry for anxiety disorder, they are managing her medications.      Sleep questionnaire Symptoms-    Prior sleep study-  Bedtime- Time to fall asleep-  Nocturnal awakenings-  Out of bed/start of day-  Weight changes-  Do you operate heavy machinery-  Do you currently wear CPAP-  Do you current wear oxygen-  [place]-      Allergies  Allergen Reactions   Adhesive [Tape] Rash    Reaction to adhesive tegaderm of zio monitor   Other Hives    Reaction to adhesive tegaderm of zio monitor    Immunization History  Administered Date(s) Administered   Moderna Sars-Covid-2 Vaccination 10/08/2019, 03/11/2020    Past Medical History:  Diagnosis Date   ABDOMINAL PAIN 03/09/2008   Qualifier: Diagnosis of  By: Alain Marion MD, Evie Lacks    Anxiety    ANXIETY 03/10/2007   Qualifier: Diagnosis of  By: Alain Marion MD, Evie Lacks    Anxiety state 03/10/2007   Formatting of this note might be different from the original. Qualifier: Diagnosis of  By: Plotnikov MD, [place] 01/09/2007   Qualifier: Diagnosis of  By: Doralee Albino     Asthma 01/09/2007   Formatting of this note might be different from the original. Qualifier: Diagnosis of  By: Doralee Albino   At risk for prolonged QT interval syndrome 09/01/2022   Attention deficit hyperactivity disorder (ADHD), combined type 08/27/2019   Bilateral bunions 07/31/2019   Bilateral occipital neuralgia 08/25/2019   Formatting of this note might be different from the original. Last Assessment &  Plan:  Formatting of this note might be different from the original.  57 y.o. female with multiple sources of chronic pain including headaches related to occipital neuralgia. Will proceed today with bilateral occipital nerve blocks. She will return in approximately 4 weeks with Mauricio Po, PA for further evaluatio   Bipolar affective disorder, current episode hypomanic ([place]) 04/05/2022   Bipolar and related disorder ([place]) 04/05/2022   Bunion 07/31/2019   Capsulitis 07/31/2019   Capsulitis of metatarsophalangeal (MTP) joint of right foot 07/31/2019   Carpal tunnel syndrome 10/14/2019   Cervicalgia 02/11/2019   Chest discomfort 11/06/2018   CHEST PAIN 09/03/2007   Qualifier: Diagnosis of  By: Jenny Reichmann MD, Hunt Oris    Chronic bilateral low back pain with bilateral sciatica 02/11/2019   Chronic nonintractable headache 04/10/2019   Last Assessment & Plan:  Formatting of this note might be different from the original. 57 year old female with fibromyalgia related chronic pain as well as chronic headaches.  She is currently on monthly aimovig with abortive Imitrex, with inadequate relief.  Today we reviewed cervical spine plain films which show mild left facet arthropathy at C4-5 without foraminal stenosis.  We discussed that h   Cigarette smoker 07/04/2019   Depression    Diarrhea 03/09/2008   Qualifier: Diagnosis of  By: Plotnikov MD, Evie Lacks    DISORDER, FUNCTIONAL, INTESTINE NOS 03/04/2007   Qualifier: Diagnosis of  By: Plotnikov MD, Evie Lacks    Ex-smoker 12/09/2018   Fatigue 08/27/2019  Fibromyalgia 02/11/2016   Last Assessment & Plan:  Formatting of this note might be different from the original. Intolerant to Cymbalta.  Recently started on Lexapro and doing well.  Will provide a refill of Lyrica 150 mg b.i.d. Formatting of this note might be different from the original. Last Assessment & Plan:  Formatting of this note might be different from the original. Intolerant to Cymbalta.  Recently  started on Lex   GAD (generalized anxiety disorder) 08/27/2019   GERD 03/10/2007   Qualifier: Diagnosis of  By: Plotnikov MD, Evie Lacks    High risk medication use 09/01/2022   Hypersomnia 09/01/2022   Impingement syndrome of right shoulder 06/16/2021   Formatting of this note might be different from the original. Added automatically from request for surgery [id number]   Irritable bowel syndrome 06/11/2007   Centricity Description: IRRITABLE BOWEL SYNDROME Qualifier: Diagnosis of  By: Alain Marion MD, Evie Lacks  Centricity Description: IBS Qualifier: Diagnosis of  By: Jenny Reichmann MD, [place] BACK PAIN 06/11/2007   Qualifier: Diagnosis of  By: Alain Marion MD, Evie Lacks    Lumbar radiculopathy 02/11/2019   Memory problem 08/27/2019   Migraines 04/14/2020   MIGRAINES, HX OF 01/09/2007   Qualifier: Diagnosis of  By: Doralee Albino     Mixed dyslipidemia 11/06/2018   Myofascial pain 02/11/2019   Last Assessment & Plan:  Formatting of this note might be different from the original. Today we did order imaging of cervical and lumbar spine to rule out any underlying pathology.  Imaging did return with no significant degenerative changes at either level.  At this time it does appear that patient's pattern of pain would be best addressed with nonnarcotic therapies, we specifically discussed add   Nontraumatic complete tear of right rotator cuff 06/16/2021   Formatting of this note might be different from the original. Added automatically from request for surgery [id number]   OAB (overactive bladder) 08/27/2019   Palpitations 11/06/2018   PTSD (post-traumatic stress disorder) 09/01/2022   PUD (peptic ulcer disease) 02/21/2022   Reactive depression 08/27/2019   SINUSITIS, ACUTE 10/02/2007   Qualifier: Diagnosis of  By: Plotnikov MD, Evie Lacks    Tear of right rotator cuff 06/16/2021   Formatting of this note might be different from the original. Added automatically from request for surgery [id number]   Tobacco  use 08/27/2019   UPPER RESPIRATORY INFECTION (URI) 08/09/2007   Qualifier: Diagnosis of  By: Alain Marion MD, Evie Lacks    WEIGHT LOSS 03/04/2007   Qualifier: Diagnosis of  By: Alain Marion MD, Evie Lacks     Tobacco History: Social History   Tobacco Use  Smoking Status Every Day  Smokeless Tobacco Never   Ready to quit: Not Answered Counseling given: Not Answered   Outpatient Medications Prior to Visit  Medication Sig Dispense Refill   AIMOVIG 70 MG/ML SOAJ Inject 1 mL into the skin every 30 (thirty) days.     albuterol (PROVENTIL HFA;VENTOLIN HFA) 108 (90 Base) MCG/ACT inhaler Inhale 1-2 puffs into the lungs every 6 (six) hours as needed for wheezing or shortness of breath.     amphetamine-dextroamphetamine (ADDERALL XR) 25 MG 24 hr capsule Take 25 mg by mouth every morning.     busPIRone (BUSPAR) 15 MG tablet Take 7.5-15 mg by mouth 3 (three) times daily as needed for anxiety.     dicyclomine (BENTYL) 20 MG tablet Take 20 mg by mouth daily.  (Patient not taking: Reported on 09/01/2022)     diltiazem (  CARDIZEM CD) 120 MG 24 hr capsule Take 1 capsule (120 mg total) by mouth daily. Patient must keep appointment for 09/25/22 further refills. 2 nd attempt 15 capsule 0   ergocalciferol (VITAMIN D2) 1.25 MG (50000 UT) capsule Take 50,000 Units by mouth every 30 (thirty) days.     escitalopram (LEXAPRO) 20 MG tablet Take 1 tablet by mouth daily.     etonogestrel (NEXPLANON) 68 MG IMPL implant Inject 68 mg into the skin once.     hydrOXYzine (ATARAX) 25 MG tablet TAKE 1 TABLET BY MOUTH THREE TIMES DAILY AS NEEDED FOR  UP  TO  10  DAYS  FOR  ITCHING (Patient not taking: Reported on 09/01/2022)     lamoTRIgine (LAMICTAL) 150 MG tablet Take 150 mg by mouth daily.     lamoTRIgine (LAMICTAL) 25 MG tablet Take 1 tablet (25 mg total) by mouth daily. Take along with 150 mg daily 30 tablet 1   nitroGLYCERIN (NITROSTAT) 0.4 MG SL tablet Place 1 tablet (0.4 mg total) under the tongue every 5 (five) minutes as  needed for chest pain. 30 tablet 3   omeprazole (PRILOSEC) 40 MG capsule Take 40 mg by mouth daily.     ondansetron (ZOFRAN-ODT) 4 MG disintegrating tablet Take 4 mg by mouth every 8 (eight) hours as needed for vomiting or nausea.     pramipexole (MIRAPEX) 0.5 MG tablet Take 0.5 mg by mouth at bedtime.     pregabalin (LYRICA) 150 MG capsule Take 150 mg by mouth 2 (two) times daily.     promethazine (PHENERGAN) 12.5 MG tablet Take 12.5 mg by mouth as needed for nausea or vomiting.     QUEtiapine (SEROQUEL) 25 MG tablet Take 1 tablet (25 mg total) by mouth at bedtime. 30 tablet 0   rizatriptan (MAXALT) 5 MG tablet Take 5 mg by mouth daily.     rosuvastatin (CRESTOR) 20 MG tablet Take 1 tablet (20 mg total) by mouth daily. 90 tablet 3   sucralfate (CARAFATE) 1 g tablet Take 1 g by mouth 4 (four) times daily.     traZODone (DESYREL) 50 MG tablet Take 100 mg by mouth at bedtime as needed for sleep.     No facility-administered medications prior to visit.      Review of Systems  Review of Systems   Physical Exam  There were no vitals taken for this visit. Physical Exam   Lab Results:  CBC    Component Value Date/Time   WBC 6.7 01/28/2020 1633   WBC 8.0 01/04/2007 0907   RBC 4.53 01/28/2020 1633   RBC 4.12 01/04/2007 0907   HGB 14.9 01/28/2020 1633   HCT 43.1 01/28/2020 1633   PLT 274 09/01/2022 1328   MCV 95 01/28/2020 1633   MCH 32.9 01/28/2020 1633   MCHC 34.6 01/28/2020 1633   MCHC 34.6 01/04/2007 0907   RDW 12.5 01/28/2020 1633   LYMPHSABS 2.5 01/28/2020 1633   MONOABS 1.0 (H) 01/04/2007 0907   EOSABS 0.3 01/28/2020 1633   BASOSABS 0.1 01/28/2020 1633    BMET    Component Value Date/Time   NA 139 01/28/2020 1633   K 4.2 01/28/2020 1633   CL 105 01/28/2020 1633   CO2 21 01/28/2020 1633   GLUCOSE 109 (H) 01/28/2020 1633   GLUCOSE 109 (H) 09/24/2006 0916   BUN 14 09/01/2022 1328   CREATININE 0.98 09/01/2022 1328   CALCIUM 9.4 01/28/2020 1633   GFRNONAA 67  01/28/2020 1633   GFRAA 77 01/28/2020 1633  BNP No results found for: "BNP"  ProBNP No results found for: "PROBNP"  Imaging: No results found.   Assessment & Plan:   No problem-specific Assessment & Plan notes found for this encounter.     Martyn Ehrich, NP 09/19/2022

## 2022-09-20 ENCOUNTER — Telehealth: Payer: Self-pay | Admitting: Psychiatry

## 2022-09-20 DIAGNOSIS — F319 Bipolar disorder, unspecified: Secondary | ICD-10-CM

## 2022-09-20 MED ORDER — QUETIAPINE FUMARATE 25 MG PO TABS: 25.0000 mg | ORAL_TABLET | Freq: Every day | ORAL | 0 refills | Status: DC

## 2022-09-20 NOTE — Telephone Encounter (Signed)
I have sent a new prescription for quetiapine 25 mg to [facility] [place].

## 2022-09-20 NOTE — Telephone Encounter (Signed)
Called the [facility] spoke to Judith Bass she stated that she would cancel the Rx for the Seroquel 25 mg also called patient to make aware no answer left voicemail.

## 2022-09-25 ENCOUNTER — Ambulatory Visit: Payer: Medicare Other | Admitting: Cardiology

## 2022-09-25 ENCOUNTER — Telehealth: Payer: Self-pay | Admitting: Psychiatry

## 2022-09-25 NOTE — Telephone Encounter (Signed)
Please contact this patient, Seroquel 25 mg was sent to [facility], [address], [place]. Please let her know to pick up the medication and to start using it.  Please advise her to keep the appointment with me on the 15th.

## 2022-09-26 ENCOUNTER — Other Ambulatory Visit: Payer: Self-pay

## 2022-09-27 NOTE — Telephone Encounter (Signed)
Called to inform patient of medication sent to [facility] no answer left message for patient to return call to office

## 2022-09-27 NOTE — Telephone Encounter (Signed)
Noted  

## 2022-09-28 ENCOUNTER — Ambulatory Visit: Payer: Medicare Other | Attending: Cardiology | Admitting: Cardiology

## 2022-09-29 ENCOUNTER — Telehealth: Payer: Self-pay | Admitting: Psychiatry

## 2022-09-29 ENCOUNTER — Encounter: Payer: Self-pay | Admitting: Cardiology

## 2022-09-29 NOTE — Telephone Encounter (Signed)
I have sent a response through my chart as well regarding this.

## 2022-09-29 NOTE — Telephone Encounter (Signed)
Called patient- no answer- left message to return call to office.  

## 2022-09-29 NOTE — Telephone Encounter (Signed)
  I slept all night but also all day & missed my cardiologist appt. I messaged my cardiologist. Is this normal to fell this tired/groggy?     Received this above message from this patient.  Will need to get more information about her concern before answering this question.  Will have CMA contact patient to clarify.

## 2022-10-02 ENCOUNTER — Telehealth: Payer: Self-pay | Admitting: Cardiology

## 2022-10-02 ENCOUNTER — Ambulatory Visit: Payer: Medicare Other | Admitting: Psychiatry

## 2022-10-02 NOTE — Telephone Encounter (Signed)
Left message for patient to call back to reschedule appointment with Dr. Tomie China.

## 2022-10-16 NOTE — Telephone Encounter (Signed)
Called to inform patient of message no answer left voicemail for patient to return call to office

## 2022-10-16 NOTE — Telephone Encounter (Signed)
Spoke to patient she stated that she has had at least 4 nightmares but she did not have one last night she would like to continue and see if things get better.

## 2022-10-18 ENCOUNTER — Other Ambulatory Visit: Payer: Self-pay | Admitting: Psychiatry

## 2022-10-18 DIAGNOSIS — F319 Bipolar disorder, unspecified: Secondary | ICD-10-CM

## 2022-10-19 ENCOUNTER — Encounter: Payer: Self-pay | Admitting: Psychiatry

## 2022-10-19 ENCOUNTER — Ambulatory Visit (INDEPENDENT_AMBULATORY_CARE_PROVIDER_SITE_OTHER): Payer: Medicare Other | Admitting: Psychiatry

## 2022-10-19 VITALS — BP 145/83 | HR 93 | Temp 97.9°F | Ht 64.6 in | Wt 162.0 lb

## 2022-10-19 DIAGNOSIS — Z91199 Patient's noncompliance with other medical treatment and regimen due to unspecified reason: Secondary | ICD-10-CM

## 2022-10-19 DIAGNOSIS — G4710 Hypersomnia, unspecified: Secondary | ICD-10-CM | POA: Diagnosis not present

## 2022-10-19 DIAGNOSIS — F319 Bipolar disorder, unspecified: Secondary | ICD-10-CM | POA: Diagnosis not present

## 2022-10-19 DIAGNOSIS — F411 Generalized anxiety disorder: Secondary | ICD-10-CM

## 2022-10-19 DIAGNOSIS — Z9189 Other specified personal risk factors, not elsewhere classified: Secondary | ICD-10-CM

## 2022-10-19 DIAGNOSIS — F4310 Post-traumatic stress disorder, unspecified: Secondary | ICD-10-CM | POA: Diagnosis not present

## 2022-10-19 HISTORY — DX: Patient's noncompliance with other medical treatment and regimen due to unspecified reason: Z91.199

## 2022-10-19 MED ORDER — SERTRALINE HCL 25 MG PO TABS: 25.0000 mg | ORAL_TABLET | Freq: Every day | ORAL | 1 refills | Status: DC

## 2022-10-19 MED ORDER — ESCITALOPRAM OXALATE 20 MG PO TABS: 10.0000 mg | ORAL_TABLET | Freq: Every day | ORAL | 0 refills | Status: DC

## 2022-10-19 MED ORDER — ESCITALOPRAM OXALATE 5 MG PO TABS: 5.0000 mg | ORAL_TABLET | Freq: Every day | ORAL | 0 refills | Status: DC

## 2022-10-19 MED ORDER — QUETIAPINE FUMARATE 25 MG PO TABS: 12.5000 mg | ORAL_TABLET | Freq: Every day | ORAL | 0 refills | Status: DC

## 2022-10-19 NOTE — Patient Instructions (Addendum)
Start taking Lexapro half tablet of the 20 mg which is a 10 mg for 10 days. Start taking Lexapro 5 mg after stopping Lexapro 10 mg, please take it for 5 days and stop taking Lexapro completely.  Start taking sertraline 25 mg p.o. daily with breakfast after you are completely done with your Lexapro.  Please stay on the sertraline until your next visit and we can increase the dosage as needed in the future.  Please talk to primary care provider about stimulant medication which likely could be contributing to insomnia as well as anxiety.  Please consider getting a sleep study completed and maybe follow-up with a provider who is specialized in fibromyalgia.  Please take  Seroquel 1/2 tablet for the next 3 days and stop taking it since you are having side effects.  Sertraline Tablets What is this medication? SERTRALINE (SER tra leen) treats depression, anxiety, obsessive-compulsive disorder (OCD), post-traumatic stress disorder (PTSD), and premenstrual dysphoric disorder (PMDD). It increases the amount of serotonin in the brain, a hormone that helps regulate mood. It belongs to a group of medications called SSRIs. This medicine may be used for other purposes; ask your health care provider or pharmacist if you have questions. COMMON BRAND NAME(S): Zoloft What should I tell my care team before I take this medication? They need to know if you have any of these conditions: Bleeding disorders Bipolar disorder or a family history of bipolar disorder Frequently drink alcohol Glaucoma Heart disease High blood pressure History of irregular heartbeat History of low levels of calcium, magnesium, or potassium in the blood Liver disease Receiving electroconvulsive therapy Seizures Suicidal thoughts, plans, or attempt by you or a family member Take medications that prevent or treat blood clots Thyroid disease An unusual or allergic reaction to sertraline, other medications, foods, dyes, or  preservatives Pregnant or trying to get pregnant Breastfeeding How should I use this medication? Take this medication by mouth with a glass of water. Take it as directed on the prescription label at the same time every day. You can take it with or without food. If it upsets your stomach, take it with food. Do not take your medication more often than directed. Keep taking this medication unless your care team tells you to stop. Stopping it too quickly can cause serious side effects. It can also make your condition worse. A special MedGuide will be given to you by the pharmacist with each prescription and refill. Be sure to read this information carefully each time. Talk to your care team about the use of this medication in children. While it may be prescribed for children as young as 7 years for selected conditions, precautions do apply. Overdosage: If you think you have taken too much of this medicine contact a poison control center or emergency room at once. NOTE: This medicine is only for you. Do not share this medicine with others. What if I miss a dose? If you miss a dose, take it as soon as you can. If it is almost time for your next dose, take only that dose. Do not take double or extra doses. What may interact with this medication? Do not take this medication with any of the following: Cisapride Dronedarone Linezolid MAOIs, such as Carbex, Eldepryl, Marplan, Nardil, and Parnate Methylene blue (injected into a vein) Pimozide Thioridazine This medication may also interact with the following: Alcohol Amphetamines Aspirin and aspirin-like medications Certain medications for fungal infections, such as ketoconazole, fluconazole, posaconazole, itraconazole Certain medications for irregular heart beat,  such as flecainide, quinidine, propafenone Certain medications for mental health conditions Certain medications for migraine headaches, such as almotriptan, eletriptan, frovatriptan,  naratriptan, rizatriptan, sumatriptan, zolmitriptan Certain medications for seizures, such as carbamazepine, valproic acid, phenytoin Certain medications for sleep Certain medications that prevent or treat blood clots, such as warfarin, enoxaparin, dalteparin Cimetidine Digoxin Diuretics Fentanyl Isoniazid Lithium NSAIDs, medications for pain and inflammation, such as ibuprofen or naproxen Other medications that cause heart rhythm changes, such as dofetilide Rasagiline Safinamide Supplements, such as St. John's wort, kava kava, valerian Tolbutamide Tramadol Tryptophan This list may not describe all possible interactions. Give your health care provider a list of all the medicines, herbs, non-prescription drugs, or dietary supplements you use. Also tell them if you smoke, drink alcohol, or use illegal drugs. Some items may interact with your medicine. What should I watch for while using this medication? Tell your care team if your symptoms do not get better or if they get worse. Visit your care team for regular checks on your progress. Because it may take several weeks to see the full effects of this medication, it is important to continue your treatment as prescribed by your care team. Patients and their families should watch out for new or worsening thoughts of suicide or depression. Also watch out for sudden changes in feelings such as feeling anxious, agitated, panicky, irritable, hostile, aggressive, impulsive, severely restless, overly excited and hyperactive, or not being able to sleep. If this happens, especially at the beginning of treatment or after a change in dose, call your care team. This medication may affect your coordination, reaction time, or judgment. Do not drive or operate machinery until you know how this medication affects you. Sit or stand up slowly to reduce the risk of dizzy or fainting spells. Drinking alcohol with this medication can increase the risk of these side  effects. Your mouth may get dry. Chewing sugarless gum or sucking hard candy, and drinking plenty of water may help. Contact your care team if the problem does not go away or is severe. What side effects may I notice from receiving this medication? Side effects that you should report to your care team as soon as possible: Allergic reactions--skin rash, itching, hives, swelling of the face, lips, tongue, or throat Bleeding--bloody or black, tar-like stools, red or dark brown urine, vomiting blood or brown material that looks like coffee grounds, small red or purple spots on skin, unusual bleeding or bruising Heart rhythm changes--fast or irregular heartbeat, dizziness, feeling faint or lightheaded, chest pain, trouble breathing Low sodium level--muscle weakness, fatigue, dizziness, headache, confusion Serotonin syndrome--irritability, confusion, fast or irregular heartbeat, muscle stiffness, twitching muscles, sweating, high fever, seizure, chills, vomiting, diarrhea Sudden eye pain or change in vision such as blurred vision, seeing halos around lights, vision loss Thoughts of suicide or self-harm, worsening mood Side effects that usually do not require medical attention (report these to your care team if they continue or are bothersome): Change in sex drive or performance Diarrhea Excessive sweating Nausea Tremors or shaking Upset stomach This list may not describe all possible side effects. Call your doctor for medical advice about side effects. You may report side effects to FDA at 1-800-FDA-1088. Where should I keep my medication? Keep out of the reach of children and pets. Store at room temperature between 20 and 25 degrees C (68 and 77 degrees F). Get rid of any unused medication after the expiration date. To get rid of medications that are no longer needed or expired:  Take the medication to a medication take-back program. Check with your pharmacy or law enforcement to find a  location. If you cannot return the medication, check the label or package insert to see if the medication should be thrown out in the garbage or flushed down the toilet. If you are not sure, ask your care team. If it is safe to put in the trash, empty the medication out of the container. Mix the medication with cat litter, dirt, coffee grounds, or other unwanted substance. Seal the mixture in a bag or container. Put it in the trash. NOTE: This sheet is a summary. It may not cover all possible information. If you have questions about this medicine, talk to your doctor, pharmacist, or health care provider.  2023 [employer] Standard (2020-07-02 00:00:00)

## 2022-10-19 NOTE — Progress Notes (Addendum)
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  Patient's last visit was on 09/01/2022.  Patient today returns reporting that she has been noncompliant with the higher dosage of Lamictal which was prescribed last visit.  She reports she does not clearly remember why she did not take the medication.  She reports she likely did not pick it up from the pharmacy today.  Patient unable to give any clear answers regarding this.  Patient today reports she continues to be extremely anxious, overwhelmed all the time.  Patient also had trouble answering questions about medications she was taking as well as questions had to be repeated and rephrased.  She  reports she has a lot going on in her life right now.  Her boyfriend had heart surgery recently.  She reports her sister recently had a stroke.  She continues to care for her grandchildren who are 14 and 76 years old, she has custody.  She continues to worry about her daughter who is currently struggling with legal issues, substance abuse.  Patient seem to be preoccupied with getting started back on lorazepam.  She reports she currently takes the BuSpar daily.  Previously she was only taking it as needed.  Patient reports she continues to struggle with a lot of fatigue.Patient reports she also problems with her fibromyalgia.  Reports the Adderall helping with fatigue during the day.  Although she continues to be forgetful, misplacing things, trouble comprehending and so  on.  Patient reports she also struggles with insomnia at night.  Never been tested for ADHD.  Likely stimulants contributing to a lot of her anxiety and sleep problems.  Patient reports she did not like the Seroquel since she felt she was hallucinating on the Seroquel and also it gave her nightmares.  Patient agreeable to stopping the Seroquel.  She does have upcoming cardiology appointment coming up, motivated to keep it.  Patient was referred for sleep study consultation last visit.  Reports she is noncompliant with that however agrees to give them a call.  Patient agreeable to trial of sertraline which she has never tried before for anxiety and mood symptoms.  Agreeable to tapering off the Lexapro which likely not beneficial at this time.  Denies any suicidality, homicidality .  Denies any other concerns today.  Visit Diagnosis:    ICD-10-CM   1. Bipolar and related disorder (HCC)  F31.9 QUEtiapine (SEROQUEL) 25 MG tablet   Rule out bipolar type I versus substance-induced, stimulant induced bipolar and related disorder    2. GAD (generalized anxiety disorder)  F41.1 escitalopram (LEXAPRO) 5 MG tablet    sertraline (ZOLOFT) 25 MG tablet    escitalopram (LEXAPRO) 20 MG tablet    DISCONTINUED: escitalopram (LEXAPRO) 20 MG tablet    3. PTSD (post-traumatic stress disorder)  F43.10 escitalopram (LEXAPRO) 5 MG tablet    sertraline (ZOLOFT) 25 MG tablet    4. Hypersomnia  G47.10     5. At risk for prolonged QT interval syndrome  Z91.89     6. Noncompliance  with treatment plan  Z91.199       Past Psychiatric History: I have reviewed past psych history from progress note on 09/01/2022.  Past trials of medications-Lunesta, Cymbalta-caused hallucination, Lamictal, trazodone, Provigil, gabapentin-weight gain, Seroquel-hallucinations, multiple other medication trials.  Past Medical History:  Past Medical History:  Diagnosis Date   ABDOMINAL PAIN 03/09/2008   Qualifier: Diagnosis of  By:  Plotnikov MD, Georgina Quint    Anxiety    ANXIETY 03/10/2007   Qualifier: Diagnosis of  By: Posey Rea MD, Georgina Quint    Anxiety state 03/10/2007   Formatting of this note might be different from the original. Qualifier: Diagnosis of  By: Plotnikov MD, Georgina Quint   ASTHMA 01/09/2007   Qualifier: Diagnosis of  By: Tora Perches     Asthma 01/09/2007   Formatting of this note might be different from the original. Qualifier: Diagnosis of  By: Tora Perches   At risk for prolonged QT interval syndrome 09/01/2022   Attention deficit hyperactivity disorder (ADHD), combined type 08/27/2019   Bilateral bunions 07/31/2019   Bilateral occipital neuralgia 08/25/2019   Formatting of this note might be different from the original. Last Assessment & Plan:  Formatting of this note might be different from the original.  57 y.o. female with multiple sources of chronic pain including headaches related to occipital neuralgia. Will proceed today with bilateral occipital nerve blocks. She will return in approximately 4 weeks with Georgian Co, PA for further evaluatio   Bipolar affective disorder, current episode hypomanic (HCC) 04/05/2022   Bipolar and related disorder (HCC) 04/05/2022   Bunion 07/31/2019   Burning mouth syndrome 02/17/2021   Capsulitis 07/31/2019   Capsulitis of metatarsophalangeal (MTP) joint of right foot 07/31/2019   Carpal tunnel syndrome 10/14/2019   Cervicalgia 02/11/2019   Chest discomfort 11/06/2018   CHEST PAIN 09/03/2007   Qualifier: Diagnosis of  By: Jonny Ruiz MD, Len Blalock    Chronic bilateral low back pain with bilateral sciatica 02/11/2019   Chronic nonintractable headache 04/10/2019   Last Assessment & Plan:  Formatting of this note might be different from the original. 57 year old female with fibromyalgia related chronic pain as well as chronic headaches.  She is currently on monthly aimovig with abortive Imitrex, with inadequate relief.  Today we reviewed cervical spine plain films  which show mild left facet arthropathy at C4-5 without foraminal stenosis.  We discussed that h   Cigarette smoker 07/04/2019   Depression    Diarrhea 03/09/2008   Qualifier: Diagnosis of  By: Plotnikov MD, Georgina Quint    DISORDER, FUNCTIONAL, INTESTINE NOS 03/04/2007   Qualifier: Diagnosis of  By: Posey Rea MD, Georgina Quint    Ex-smoker 12/09/2018   Fatigue 08/27/2019   Fibromyalgia 02/11/2016   Last Assessment & Plan:  Formatting of this note might be different from the original. Intolerant to Cymbalta.  Recently started on Lexapro and doing well.  Will provide a refill of Lyrica 150 mg b.i.d. Formatting of this note might be different from the original. Last Assessment & Plan:  Formatting of this note might be different from the original. Intolerant to Cymbalta.  Recently started on Lex   GAD (generalized anxiety disorder) 08/27/2019   GERD 03/10/2007   Qualifier: Diagnosis of  By: Plotnikov MD, Georgina Quint    High risk medication use 09/01/2022   Hypersomnia 09/01/2022   Impingement syndrome of right shoulder 06/16/2021   Formatting of this note might be different from the original. Added automatically from request for surgery [id number]  Irritable bowel syndrome 06/11/2007   Centricity Description: IRRITABLE BOWEL SYNDROME Qualifier: Diagnosis of  By: Posey Rea MD, Georgina Quint  Centricity Description: IBS Qualifier: Diagnosis of  By: Jonny Ruiz MD, Len Blalock    LOW BACK PAIN 06/11/2007   Qualifier: Diagnosis of  By: Posey Rea MD, Georgina Quint    Lumbar radiculopathy 02/11/2019   Memory problem 08/27/2019   Migraines 04/14/2020   MIGRAINES, HX OF 01/09/2007   Qualifier: Diagnosis of  By: Tora Perches     Mixed dyslipidemia 11/06/2018   Myofascial pain 02/11/2019   Last Assessment & Plan:  Formatting of this note might be different from the original. Today we did order imaging of cervical and lumbar spine to rule out any underlying pathology.  Imaging did return with no significant degenerative changes  at either level.  At this time it does appear that patient's pattern of pain would be best addressed with nonnarcotic therapies, we specifically discussed add   Nontraumatic complete tear of right rotator cuff 06/16/2021   Formatting of this note might be different from the original. Added automatically from request for surgery [id number]   OAB (overactive bladder) 08/27/2019   Palpitations 11/06/2018   PTSD (post-traumatic stress disorder) 09/01/2022   PUD (peptic ulcer disease) 02/21/2022   Reactive depression 08/27/2019   SINUSITIS, ACUTE 10/02/2007   Qualifier: Diagnosis of  By: Plotnikov MD, Georgina Quint    Tear of right rotator cuff 06/16/2021   Formatting of this note might be different from the original. Added automatically from request for surgery [id number]   Tobacco use 08/27/2019   UPPER RESPIRATORY INFECTION (URI) 08/09/2007   Qualifier: Diagnosis of  By: Posey Rea MD, Georgina Quint    WEIGHT LOSS 03/04/2007   Qualifier: Diagnosis of  By: Posey Rea MD, Georgina Quint     Past Surgical History:  Procedure Laterality Date   rotator cuff surgery Right     Family Psychiatric History: Reviewed family psychiatric history from progress note on 09/01/2022.  Family History:  Family History  Problem Relation Age of Onset   Bipolar disorder Mother    Stroke Sister    Bipolar disorder Sister    Stroke Maternal Grandfather    Stroke Maternal Grandmother    Drug abuse Daughter     Social History: Reviewed social history from progress note on 09/01/2022. Social History   Socioeconomic History   Marital status: Divorced    Spouse name: Not on file   Number of children: 1   Years of education: Not on file   Highest education level: Some college, no degree  Occupational History   Not on file  Tobacco Use   Smoking status: Every Day   Smokeless tobacco: Never  Vaping Use   Vaping Use: Some days  Substance and Sexual Activity   Alcohol use: Not Currently   Drug use: Not Currently   Sexual  activity: Yes  Other Topics Concern   Not on file  Social History Narrative   Not on file   Social Determinants of Health   Financial Resource Strain: Not on file  Food Insecurity: Not on file  Transportation Needs: Not on file  Physical Activity: Not on file  Stress: Not on file  Social Connections: Not on file    Allergies:  Allergies  Allergen Reactions   Adhesive [Tape] Rash    Reaction to adhesive tegaderm of zio monitor   Other Hives    Reaction to adhesive tegaderm of zio monitor    Metabolic Disorder Labs: Lab Results  Component Value Date   HGBA1C 5.8 (H) 09/01/2022   No results found for: "PROLACTIN" Lab Results  Component Value Date   CHOL 151 09/01/2022   TRIG 67 09/01/2022   HDL 66 09/01/2022   CHOLHDL 6.2 (H) 01/28/2020   VLDL 8 09/24/2006   LDLCALC 72 09/01/2022   LDLCALC 149 (H) 01/28/2020   Lab Results  Component Value Date   TSH 1.110 09/01/2022   TSH 2.340 01/28/2020    Therapeutic Level Labs: No results found for: "LITHIUM" No results found for: "VALPROATE" No results found for: "CBMZ"  Current Medications: Current Outpatient Medications  Medication Sig Dispense Refill   AIMOVIG 70 MG/ML SOAJ Inject 1 mL into the skin every 30 (thirty) days.     albuterol (PROVENTIL HFA;VENTOLIN HFA) 108 (90 Base) MCG/ACT inhaler Inhale 1-2 puffs into the lungs every 6 (six) hours as needed for wheezing or shortness of breath.     amphetamine-dextroamphetamine (ADDERALL XR) 25 MG 24 hr capsule Take 25 mg by mouth every morning.     busPIRone (BUSPAR) 15 MG tablet Take 7.5-15 mg by mouth 3 (three) times daily as needed for anxiety.     dicyclomine (BENTYL) 20 MG tablet Take 20 mg by mouth daily.     diltiazem (CARDIZEM CD) 120 MG 24 hr capsule Take 1 capsule (120 mg total) by mouth daily. Patient must keep appointment for 09/25/22 further refills. 2 nd attempt 15 capsule 0   ergocalciferol (VITAMIN D2) 1.25 MG (50000 UT) capsule Take 50,000 Units by mouth  every 30 (thirty) days.     [START ON 10/28/2022] escitalopram (LEXAPRO) 5 MG tablet Take 1 tablet (5 mg total) by mouth daily for 5 days. Stop taking after 5 days , weaning off 5 tablet 0   etonogestrel (NEXPLANON) 68 MG IMPL implant Inject 68 mg into the skin once.     lamoTRIgine (LAMICTAL) 150 MG tablet Take 150 mg by mouth daily.     lamoTRIgine (LAMICTAL) 25 MG tablet Take 1 tablet (25 mg total) by mouth daily. Take along with 150 mg daily 30 tablet 1   nitroGLYCERIN (NITROSTAT) 0.4 MG SL tablet Place 1 tablet (0.4 mg total) under the tongue every 5 (five) minutes as needed for chest pain. 30 tablet 3   omeprazole (PRILOSEC) 40 MG capsule Take 40 mg by mouth daily.     ondansetron (ZOFRAN-ODT) 4 MG disintegrating tablet Take 4 mg by mouth every 8 (eight) hours as needed for vomiting or nausea.     pramipexole (MIRAPEX) 0.5 MG tablet Take 0.5 mg by mouth at bedtime.     pregabalin (LYRICA) 150 MG capsule Take 150 mg by mouth 2 (two) times daily.     promethazine (PHENERGAN) 12.5 MG tablet Take 12.5 mg by mouth as needed for nausea or vomiting.     rizatriptan (MAXALT) 5 MG tablet Take 5 mg by mouth daily.     rosuvastatin (CRESTOR) 20 MG tablet Take 1 tablet (20 mg total) by mouth daily. 90 tablet 3   [START ON 11/01/2022] sertraline (ZOLOFT) 25 MG tablet Take 1 tablet (25 mg total) by mouth daily with breakfast. 30 tablet 1   sucralfate (CARAFATE) 1 g tablet Take 1 g by mouth 4 (four) times daily.     escitalopram (LEXAPRO) 20 MG tablet Take 0.5 tablets (10 mg total) by mouth daily for 10 days. 5 tablet 0   QUEtiapine (SEROQUEL) 25 MG tablet Take 0.5 tablets (12.5 mg total) by mouth at bedtime for 3 days.  Stop taking in 3 days 1 tablet 0   No current facility-administered medications for this visit.     Musculoskeletal: Strength & Muscle Tone: within normal limits Gait & Station: normal Patient leans: N/A  Psychiatric Specialty Exam: Review of Systems  Unable to perform ROS:  Psychiatric disorder    Blood pressure (!) 145/83, pulse 93, temperature 97.9 F (36.6 C), temperature source Skin, height 5' 4.6" (1.641 m), weight 162 lb (73.5 kg).Body mass index is 27.29 kg/m.  General Appearance: Casual  Eye Contact:  Fair  Speech:  Clear and Coherent  Volume:  Normal  Mood:  Anxious  Affect:  Congruent  Thought Process:  Goal Directed and Descriptions of Associations: Intact  Orientation:  Full (Time, Place, and Person)  Thought Content: Logical   Suicidal Thoughts:  No  Homicidal Thoughts:  No  Memory:  Immediate;   Fair Recent;   Fair Remote;   Poor, reports short term memory problems  Judgement:  Fair  Insight:  Shallow  Psychomotor Activity:  Restlessness  Concentration:  Concentration: Poor and Attention Span: Poor  Recall:  Poor  Fund of Knowledge: Fair  Language: Fair  Akathisia:  No  Handed:  Left  AIMS (if indicated): not done  Assets:  Communication Skills Desire for Improvement Housing Social Support  ADL's:  Intact  Cognition: WNL  Sleep:  Poor   Screenings: GAD-7    Loss adjuster, chartered Office Visit from 10/19/2022 in [facility] Office Visit from 09/01/2022 in [facility] Office Visit from 07/23/2018 in [facility]  Total GAD-7 Score 17 18 17       PHQ2-9    Flowsheet Row Office Visit from 10/19/2022 in [facility] Office Visit from 09/01/2022 in [facility] Office Visit from 07/23/2018 in [facility]  PHQ-2 Total Score 5 5 5   PHQ-9 Total Score 16 21 22       Flowsheet Row Office Visit from 10/19/2022 in [facility] Office Visit from 09/01/2022 in [facility]  C-SSRS RISK CATEGORY No Risk No Risk        Assessment and Plan: Judith Bass is a 57 year old Caucasian female who has a history of bipolar and related disorder, PTSD, anxiety, fatigue, fibromyalgia, multiple other medical problems was evaluated in office today.  Patient currently noncompliant with recommendations that were made last visit, continues to struggle with sleep problems, concentration problems, anxiety and sleep problems likely also contributed by being on stimulants ,will benefit from following plan.  Plan Bipolar and related disorder-rule out bipolar type I versus substance-induced-stimulant induced-unstable Patient today reports she is currently taking only lamotrigine 100 mg p.o. daily.  Her dosage was readjusted last visit and patient has been noncompliant.  Patient encouraged to pick up lamotrigine 25 mg from the pharmacy which was sent out in March. Discontinue Seroquel for side effects.  GAD-stable Patient currently on Adderall which likely is not a good choice for this patient who is extremely anxious, restless and struggles with sleep issues. Continue BuSpar 15 mg p.o. 3 times daily-patient currently reports she is taking it every day. Taper off Lexapro, provided instructions to taper it off in the next 15 days.Start taking Lexapro half tablet of the 20 mg which is a 10 mg for 10 days. Start taking Lexapro 5 mg after stopping Lexapro 10 mg, please  take it for 5 days and stop taking Lexapro completely. Start sertraline 25 mg p.o. daily with breakfast only once completely stop the Lexapro.  Patient on polypharmacy, given the risk of drug to drug interaction including serotonin syndrome. Patient was referred for CBT last visit-noncompliant.  Patient was provided resources.  PTSD-unstable Taper off Lexapro as noted above Start sertraline 25 mg p.o. daily with breakfast Continue BuSpar as noted above Patient referred for CBT-patient noncompliant  Hypersomnia-unstable Patient was referred to pulmonology for sleep consultation, possibility of  sleep study.  Patient however has been noncompliant.  Encouraged to call them. Patient also with insomnia at night likely due to stimulants.  Patient is also on multiple CNS depressants including medications like Lyrica which likely contributing to her hypersomnia and fatigue during the day.  Patient advised to talk to her providers/primary care provider regarding stopping the stimulant medication as well as readjusting the dosage of some of her medications during the day which likely could be contributing to drowsiness, fatigue and tiredness.   Non compliance with recommendations-unstable Provided education.  Encouraged compliance.  Patient has upcoming appointment with cardiology-patient advised to sign a release to obtain medical records  I have reviewed and discussed labs with patient-09/01/2022-TSH-1.110, BUN/creatinine-within normal limits, platelet count-within normal limits, hemoglobin A1c elevated at 5.8.  Patient with elevated blood pressure reading in session initially, on repeat blood pressure trending down.  However discussed with patient the risk of being on stimulant medication especially given her blood pressure is elevated, cardiac effect.  Patient does have upcoming cardiology appointment.  Patient encouraged to keep the appointment with cardiology.  Follow-up in clinic in 4 weeks or sooner in person.  Collaboration of Care: Collaboration of Care: Other patient encouraged to follow up with primary care provider, patient encouraged to schedule an appointment with therapist-resources provided, patient encouraged to get sleep consultation completed-referred to pulmonology.  Patient/Guardian was advised Release of Information must be obtained prior to any record release in order to collaborate their care with an outside provider. Patient/Guardian was advised if they have not already done so to contact the registration department to sign all necessary forms in order for [place] to release  information regarding their care.   Consent: Patient/Guardian gives verbal consent for treatment and assignment of benefits for services provided during this visit. Patient/Guardian expressed understanding and agreed to proceed.   I have spent atleast 40 minutes face to face with patient today which includes the time spent for preparing to see the patient ( e.g., review of test, records ), obtaining and to review and separately obtained history , ordering medications and test ,psychoeducation and supportive psychotherapy and care coordination,as well as documenting clinical information in electronic health record.   This note was generated in part or whole with voice recognition software. Voice recognition is usually quite accurate but there are transcription errors that can and very often do occur. I apologize for any typographical errors that were not detected and corrected.    Jomarie Longs, MD 10/20/2022, 8:09 AM

## 2022-10-20 ENCOUNTER — Other Ambulatory Visit: Payer: Self-pay | Admitting: Psychiatry

## 2022-10-20 DIAGNOSIS — F319 Bipolar disorder, unspecified: Secondary | ICD-10-CM

## 2022-10-20 MED ORDER — ESCITALOPRAM OXALATE 20 MG PO TABS: 10.0000 mg | ORAL_TABLET | Freq: Every day | ORAL | 0 refills | Status: DC

## 2022-10-20 NOTE — Telephone Encounter (Signed)
Called patient to go over her taper instructions no answer left voicemail for patient to return call to office

## 2022-10-20 NOTE — Telephone Encounter (Signed)
Val, not sure whether you had a chance to talk with the patient. Let us know if any more questions from the patient. Thanks.

## 2022-10-23 NOTE — Telephone Encounter (Signed)
Called patient no answer left voicemail for patient to return call to office. 

## 2022-10-24 ENCOUNTER — Telehealth: Payer: Self-pay | Admitting: Psychiatry

## 2022-10-24 NOTE — Telephone Encounter (Signed)
Please contact this patient if we have any sooner appointments.  Please place her on a wait list.

## 2022-10-26 ENCOUNTER — Encounter: Payer: Self-pay | Admitting: Cardiology

## 2022-10-26 ENCOUNTER — Other Ambulatory Visit: Payer: Self-pay | Admitting: Cardiology

## 2022-10-26 MED ORDER — DILTIAZEM HCL ER COATED BEADS 120 MG PO CP24: 120.0000 mg | ORAL_CAPSULE | Freq: Every day | ORAL | 0 refills | Status: DC

## 2022-10-27 ENCOUNTER — Telehealth: Payer: Self-pay | Admitting: Psychiatry

## 2022-10-27 ENCOUNTER — Encounter: Payer: Self-pay | Admitting: Cardiology

## 2022-10-27 DIAGNOSIS — F319 Bipolar disorder, unspecified: Secondary | ICD-10-CM

## 2022-10-27 NOTE — Telephone Encounter (Signed)
Please contact this patient's pharmacy at [facility], [address] , to clarify her dosage of lamotrigine.  Once we clarify that we will be able to send a refill if needed to the pharmacy.

## 2022-10-27 NOTE — Telephone Encounter (Signed)
[facility] in [place] would like a referral from you. Marylene Land from their office called, the number is [phone] & the referral can be faxed to [phone]  Received the above message from patient.

## 2022-10-30 ENCOUNTER — Ambulatory Visit: Payer: Medicare Other | Admitting: Cardiology

## 2022-10-30 ENCOUNTER — Encounter: Payer: Self-pay | Admitting: Cardiology

## 2022-10-30 MED ORDER — LAMOTRIGINE 150 MG PO TABS: 150.0000 mg | ORAL_TABLET | Freq: Every day | ORAL | 1 refills | Status: DC

## 2022-10-30 MED ORDER — LAMOTRIGINE 25 MG PO TABS
25.0000 mg | ORAL_TABLET | Freq: Every day | ORAL | 1 refills | Status: AC
Start: 2022-10-30 — End: ?

## 2022-10-30 NOTE — Telephone Encounter (Signed)
I have sent Lamictal 175 mg daily to patient's pharmacy.  Please let patient know to let us know if she has not been compliant with Lamictal since if she were not compliant , she will have to start low and titrate up .  Please let patient know.

## 2022-10-30 NOTE — Addendum Note (Signed)
Addended byJomarie Longs on: 10/30/2022 12:40 PM   Modules accepted: Orders

## 2022-10-30 NOTE — Telephone Encounter (Signed)
Noted  

## 2022-10-30 NOTE — Telephone Encounter (Signed)
Called patient no answer left voicemail for patient to return call to office. 

## 2022-10-30 NOTE — Telephone Encounter (Signed)
faxed and confirmed referral for [facility]

## 2022-10-30 NOTE — Telephone Encounter (Signed)
Called patients pharmacy spoke to [facility] she stated that the patient is taking Lamotrigine 150 mg once daily.

## 2022-11-07 ENCOUNTER — Other Ambulatory Visit: Payer: Self-pay

## 2022-11-08 ENCOUNTER — Ambulatory Visit: Payer: Medicare Other | Attending: Cardiology | Admitting: Cardiology

## 2022-11-08 ENCOUNTER — Encounter: Payer: Self-pay | Admitting: Cardiology

## 2022-11-08 VITALS — BP 140/80 | HR 94 | Ht 64.6 in | Wt 159.2 lb

## 2022-11-08 DIAGNOSIS — I10 Essential (primary) hypertension: Secondary | ICD-10-CM

## 2022-11-08 DIAGNOSIS — R0609 Other forms of dyspnea: Secondary | ICD-10-CM

## 2022-11-08 DIAGNOSIS — R072 Precordial pain: Secondary | ICD-10-CM | POA: Insufficient documentation

## 2022-11-08 DIAGNOSIS — E782 Mixed hyperlipidemia: Secondary | ICD-10-CM | POA: Diagnosis present

## 2022-11-08 HISTORY — DX: Other forms of dyspnea: R06.09

## 2022-11-08 HISTORY — DX: Essential (primary) hypertension: I10

## 2022-11-08 MED ORDER — METOPROLOL TARTRATE 100 MG PO TABS: 100.0000 mg | ORAL_TABLET | Freq: Once | ORAL | 0 refills | Status: DC

## 2022-11-08 NOTE — Patient Instructions (Signed)
Medication Instructions:  Your physician recommends that you continue on your current medications as directed. Please refer to the Current Medication list given to you today.   *If you need a refill on your cardiac medications before your next appointment, please call your pharmacy*   Lab Work: none  If you have labs (blood work) drawn today and your tests are completely normal, you will receive your results only by: MyChart Message (if you have MyChart) OR A paper copy in the mail If you have any lab test that is abnormal or we need to change your treatment, we will call you to review the results.   Testing/Procedures: Your physician has requested that you have an echocardiogram. Echocardiography is a painless test that uses sound waves to create images of your heart. It provides your doctor with information about the size and shape of your heart and how well your heart's chambers and valves are working. This procedure takes approximately one hour. There are no restrictions for this procedure. Please do NOT wear cologne, perfume, aftershave, or lotions (deodorant is allowed). Please arrive 15 minutes prior to your appointment time.     Your cardiac CT will be scheduled at one of the below locations:   [facility] [address] [place], [place] [address] [phone]  If scheduled at [facility], please arrive at the [facility] (Entrance C2) of [facility] 30 minutes prior to test start time. You can use the FREE valet parking offered at entrance C (encouraged to control the heart rate for the test)  Proceed to the [facility] Radiology Department (first floor) to check-in and test prep.  All radiology patients and guests should use entrance C2 at [facility], accessed from [facility], even though the hospital's physical address listed is [address]     Please follow these instructions  carefully (unless otherwise directed):  On the Night Before the Test: Be sure to Drink plenty of water. Do not consume any caffeinated/decaffeinated beverages or chocolate 12 hours prior to your test. Do not take any antihistamines 12 hours prior to your test.  On the Day of the Test: Drink plenty of water until 1 hour prior to the test. Do not eat any food 1 hour prior to test. You may take your regular medications prior to the test.  Take metoprolol (Lopressor) two hours prior to test. This will be a one time dose. FEMALES- please wear underwire-free bra if available, avoid dresses & tight clothing      After the Test: Drink plenty of water. After receiving IV contrast, you may experience a mild flushed feeling. This is normal. On occasion, you may experience a mild rash up to 24 hours after the test. This is not dangerous. If this occurs, you can take Benadryl 25 mg and increase your fluid intake. If you experience trouble breathing, this can be serious. If it is severe call 911 IMMEDIATELY. If it is mild, please call our office. If you take any of these medications: Glipizide/Metformin, Avandament, Glucavance, please do not take 48 hours after completing test unless otherwise instructed.  We will call to schedule your test 2-4 weeks out understanding that some insurance companies will need an authorization prior to the service being performed.   For non-scheduling related questions, please contact the cardiac imaging nurse navigator should you have any questions/concerns: Rockwell [place], Cardiac Imaging Nurse Navigator Larey Brick, Cardiac Imaging Nurse Navigator [facility] Direct Office Dial: [phone]   For scheduling needs, including cancellations and rescheduling, please call [place], [phone].   Your next appointment:   12 month(s)  The format for your next appointment:   In Person  Provider:   Belva Crome, MD   Other  Instructions Cardiac CT Angiogram A cardiac CT angiogram is a procedure to look at the heart and the area around the heart. It may be done to help find the cause of chest pains or other symptoms of heart disease. During this procedure, a substance called contrast dye is injected into the blood vessels in the area to be checked. A large X-ray machine, called a CT scanner, then takes detailed pictures of the heart and the surrounding area. The procedure is also sometimes called a coronary CT angiogram, coronary artery scanning, or CTA. A cardiac CT angiogram allows the health care provider to see how well blood is flowing to and from the heart. The health care provider will be able to see if there are any problems, such as: Blockage or narrowing of the coronary arteries in the heart. Fluid around the heart. Signs of weakness or disease in the muscles, valves, and tissues of the heart. Tell a health care provider about: Any allergies you have. This is especially important if you have had a previous allergic reaction to contrast dye. All medicines you are taking, including vitamins, herbs, eye drops, creams, and over-the-counter medicines. Any blood disorders you have. Any surgeries you have had. Any medical conditions you have. Whether you are pregnant or may be pregnant. Any anxiety disorders, chronic pain, or other conditions you have that may increase your stress or prevent you from lying still. What are the risks? Generally, this is a safe procedure. However, problems may occur, including: Bleeding. Infection. Allergic reactions to medicines or dyes. Damage to other structures or organs. Kidney damage from the contrast dye that is used. Increased risk of cancer from radiation exposure. This risk is low. Talk with your health care provider about: The risks and benefits of testing. How you can receive the lowest dose of radiation. What happens before the procedure? Wear comfortable clothing  and remove any jewelry, glasses, dentures, and hearing aids. Follow instructions from your health care provider about eating and drinking. This may include: For 12 hours before the procedure -- avoid caffeine. This includes tea, coffee, soda, energy drinks, and diet pills. Drink plenty of water or other fluids that do not have caffeine in them. Being well hydrated can prevent complications. For 4-6 hours before the procedure -- stop eating and drinking. The contrast dye can cause nausea, but this is less likely if your stomach is empty. Ask your health care provider about changing or stopping your regular medicines. This is especially important if you are taking diabetes medicines, blood thinners, or medicines to treat problems with erections (erectile dysfunction). What happens during the procedure?  Hair on your chest may need to be removed so that small sticky patches called electrodes can be placed on your chest. These will transmit information that helps to monitor your heart during the procedure. An IV will be inserted into one of your veins. You might be given a medicine to control your heart rate during the procedure. This will help to ensure that good images are obtained. You will be asked to lie on an exam table. This table will slide in and out of the CT machine during the procedure. Contrast dye will be injected into  the IV. You might feel warm, or you may get a metallic taste in your mouth. You will be given a medicine called nitroglycerin. This will relax or dilate the arteries in your heart. The table that you are lying on will move into the CT machine tunnel for the scan. The person running the machine will give you instructions while the scans are being done. You may be asked to: Keep your arms above your head. Hold your breath. Stay very still, even if the table is moving. When the scanning is complete, you will be moved out of the machine. The IV will be removed. The procedure  may vary among health care providers and hospitals. What can I expect after the procedure? After your procedure, it is common to have: A metallic taste in your mouth from the contrast dye. A feeling of warmth. A headache from the nitroglycerin. Follow these instructions at home: Take over-the-counter and prescription medicines only as told by your health care provider. If you are told, drink enough fluid to keep your urine pale yellow. This will help to flush the contrast dye out of your body. Most people can return to their normal activities right after the procedure. Ask your health care provider what activities are safe for you. It is up to you to get the results of your procedure. Ask your health care provider, or the department that is doing the procedure, when your results will be ready. Keep all follow-up visits as told by your health care provider. This is important. Contact a health care provider if: You have any symptoms of allergy to the contrast dye. These include: Shortness of breath. Rash or hives. A racing heartbeat. Summary A cardiac CT angiogram is a procedure to look at the heart and the area around the heart. It may be done to help find the cause of chest pains or other symptoms of heart disease. During this procedure, a large X-ray machine, called a CT scanner, takes detailed pictures of the heart and the surrounding area after a contrast dye has been injected into blood vessels in the area. Ask your health care provider about changing or stopping your regular medicines before the procedure. This is especially important if you are taking diabetes medicines, blood thinners, or medicines to treat erectile dysfunction. If you are told, drink enough fluid to keep your urine pale yellow. This will help to flush the contrast dye out of your body. This information is not intended to replace advice given to you by your health care provider. Make sure you discuss any questions you  have with your health care provider. Document Revised: 01/29/2019 Document Reviewed: 01/29/2019 Elsevier Patient Education  2020 [employer].  Echocardiogram An echocardiogram is a test that uses sound waves (ultrasound) to produce images of the heart. Images from an echocardiogram can provide important information about: Heart size and shape. The size and thickness and movement of your heart's walls. Heart muscle function and strength. Heart valve function or if you have stenosis. Stenosis is when the heart valves are too narrow. If blood is flowing backward through the heart valves (regurgitation). A tumor or infectious growth around the heart valves. Areas of heart muscle that are not working well because of poor blood flow or injury from a heart attack. Aneurysm detection. An aneurysm is a weak or damaged part of an artery wall. The wall bulges out from the normal force of blood pumping through the body. Tell a health care provider about: Any allergies  you have. All medicines you are taking, including vitamins, herbs, eye drops, creams, and over-the-counter medicines. Any blood disorders you have. Any surgeries you have had. Any medical conditions you have. Whether you are pregnant or may be pregnant. What are the risks? Generally, this is a safe test. However, problems may occur, including an allergic reaction to dye (contrast) that may be used during the test. What happens before the test? No specific preparation is needed. You may eat and drink normally. What happens during the test?  You will take off your clothes from the waist up and put on a hospital gown. Electrodes or electrocardiogram (ECG)patches may be placed on your chest. The electrodes or patches are then connected to a device that monitors your heart rate and rhythm. You will lie down on a table for an ultrasound exam. A gel will be applied to your chest to help sound waves pass through your skin. A handheld  device, called a transducer, will be pressed against your chest and moved over your heart. The transducer produces sound waves that travel to your heart and bounce back (or "echo" back) to the transducer. These sound waves will be captured in real-time and changed into images of your heart that can be viewed on a video monitor. The images will be recorded on a computer and reviewed by your health care provider. You may be asked to change positions or hold your breath for a short time. This makes it easier to get different views or better views of your heart. In some cases, you may receive contrast through an IV in one of your veins. This can improve the quality of the pictures from your heart. The procedure may vary among health care providers and hospitals. What can I expect after the test? You may return to your normal, everyday life, including diet, activities, and medicines, unless your health care provider tells you not to do that. Follow these instructions at home: It is up to you to get the results of your test. Ask your health care provider, or the department that is doing the test, when your results will be ready. Keep all follow-up visits. This is important. Summary An echocardiogram is a test that uses sound waves (ultrasound) to produce images of the heart. Images from an echocardiogram can provide important information about the size and shape of your heart, heart muscle function, heart valve function, and other possible heart problems. You do not need to do anything to prepare before this test. You may eat and drink normally. After the echocardiogram is completed, you may return to your normal, everyday life, unless your health care provider tells you not to do that. This information is not intended to replace advice given to you by your health care provider. Make sure you discuss any questions you have with your health care provider. Document Revised: 02/16/2021 Document Reviewed:  01/27/2020 Elsevier Patient Education  2023 [employer].

## 2022-11-08 NOTE — Progress Notes (Signed)
Cardiology Office Note:    Date:  11/08/2022   ID:  Judith Bass, DOB 1966-01-13, MRN [id number]  PCP:  Montez Hageman, DO  Cardiologist:  Garwin Brothers, MD   Referring MD: Montez Hageman, DO    ASSESSMENT:    1. DOE (dyspnea on exertion)   2. Essential hypertension   3. Mixed dyslipidemia    PLAN:    In order of problems listed above:  Primary prevention stressed with the patient.  Importance of compliance with diet medication stressed and patient verbalized standing. Dyspnea on exertion: Patient has multiple risk factors for coronary artery disease and will do a CT coronary angiography with FFR and she is agreeable. Cardiac murmur: Echocardiogram will be done to assess murmur heard on auscultation. Essential hypertension: Blood pressure stable and diet was emphasized lifestyle modification urged.  She will keep a track of her blood pressures at home. Mixed dyslipidemia: On lipid-lowering medications followed by primary care.  Lipid numbers are fine. Cigarette smoker: She quit smoking 2 months ago and promises never to get back. Patient will be seen in follow-up appointment in 6 months or earlier if the patient has any concerns.   Medication Adjustments/Labs and Tests Ordered: Current medicines are reviewed at length with the patient today.  Concerns regarding medicines are outlined above.  No orders of the defined types were placed in this encounter.  No orders of the defined types were placed in this encounter.    No chief complaint on file.    History of Present Illness:    Judith Bass is a 57 y.o. female.  Patient has past medical history of hypertension dyslipidemia.  She quit smoking 2 months ago.  She mentions to me that sometimes she has dyspnea on exertion and is concerned about it.  Palpitations have resolved.  No chest pain.  No dizziness or syncope.  At the time of my evaluation, the patient is alert awake oriented and in no distress.  Past  Medical History:  Diagnosis Date   ABDOMINAL PAIN 03/09/2008   Qualifier: Diagnosis of   By: Plotnikov MD, Georgina Quint     Replacing diagnoses that were inactivated after the 09/18/22 regulatory import   Anxiety    Anxiety state 03/10/2007   Formatting of this note might be different from the original. Qualifier: Diagnosis of  By: Plotnikov MD, Georgina Quint   Asthma 01/09/2007   Formatting of this note might be different from the original. Qualifier: Diagnosis of  By: Tora Perches   At risk for prolonged QT interval syndrome 09/01/2022   Attention deficit hyperactivity disorder (ADHD), combined type 08/27/2019   Bilateral bunions 07/31/2019   Bilateral occipital neuralgia 08/25/2019   Formatting of this note might be different from the original. Last Assessment & Plan:  Formatting of this note might be different from the original.  57 y.o. female with multiple sources of chronic pain including headaches related to occipital neuralgia. Will proceed today with bilateral occipital nerve blocks. She will return in approximately 4 weeks with Georgian Co, PA for further evaluatio   Bipolar affective disorder, current episode hypomanic (HCC) 04/05/2022   Bipolar and related disorder (HCC) 04/05/2022   Bunion 07/31/2019   Burning mouth syndrome 02/17/2021   Capsulitis 07/31/2019   Capsulitis of metatarsophalangeal (MTP) joint of right foot 07/31/2019   Carpal tunnel syndrome 10/14/2019   Cervicalgia 02/11/2019   Chest discomfort 11/06/2018   CHEST PAIN 09/03/2007   Qualifier: Diagnosis of  By: Jonny Ruiz MD,  Len Blalock    Chronic bilateral low back pain with bilateral sciatica 02/11/2019   Chronic nonintractable headache 04/10/2019   Last Assessment & Plan:  Formatting of this note might be different from the original. 57 year old female with fibromyalgia related chronic pain as well as chronic headaches.  She is currently on monthly aimovig with abortive Imitrex, with inadequate relief.  Today we reviewed  cervical spine plain films which show mild left facet arthropathy at C4-5 without foraminal stenosis.  We discussed that h   Cigarette smoker 07/04/2019   Depression    Diarrhea 03/09/2008   Qualifier: Diagnosis of  By: Plotnikov MD, Georgina Quint    DISORDER, FUNCTIONAL, INTESTINE NOS 03/04/2007   Qualifier: Diagnosis of   By: Posey Rea MD, Georgina Quint     Replacing diagnoses that were inactivated after the 09/18/22 regulatory import   Ex-smoker 12/09/2018   Fatigue 08/27/2019   Fibromyalgia 02/11/2016   Last Assessment & Plan:  Formatting of this note might be different from the original. Intolerant to Cymbalta.  Recently started on Lexapro and doing well.  Will provide a refill of Lyrica 150 mg b.i.d. Formatting of this note might be different from the original. Last Assessment & Plan:  Formatting of this note might be different from the original. Intolerant to Cymbalta.  Recently started on Lex   GAD (generalized anxiety disorder) 08/27/2019   GERD 03/10/2007   Qualifier: Diagnosis of  By: Plotnikov MD, Georgina Quint    High risk medication use 09/01/2022   Hypersomnia 09/01/2022   Impingement syndrome of right shoulder 06/16/2021   Formatting of this note might be different from the original. Added automatically from request for surgery [id number]   Irritable bowel syndrome 06/11/2007   Centricity Description: IRRITABLE BOWEL SYNDROME Qualifier: Diagnosis of  By: Posey Rea MD, Georgina Quint  Centricity Description: IBS Qualifier: Diagnosis of  By: Jonny Ruiz MD, Len Blalock    LOW BACK PAIN 06/11/2007   Qualifier: Diagnosis of  By: Posey Rea MD, Georgina Quint    Lumbar radiculopathy 02/11/2019   Memory problem 08/27/2019   Migraines 04/14/2020   MIGRAINES, HX OF 01/09/2007   Qualifier: Diagnosis of  By: Tora Perches     Mixed dyslipidemia 11/06/2018   Myofascial pain 02/11/2019   Last Assessment & Plan:  Formatting of this note might be different from the original. Today we did order imaging of cervical and  lumbar spine to rule out any underlying pathology.  Imaging did return with no significant degenerative changes at either level.  At this time it does appear that patient's pattern of pain would be best addressed with nonnarcotic therapies, we specifically discussed add   Noncompliance with treatment plan 10/19/2022   OAB (overactive bladder) 08/27/2019   Palpitations 11/06/2018   PTSD (post-traumatic stress disorder) 09/01/2022   PUD (peptic ulcer disease) 02/21/2022   Reactive depression 08/27/2019   SINUSITIS, ACUTE 10/02/2007   Qualifier: Diagnosis of  By: Plotnikov MD, Georgina Quint    Tear of right rotator cuff 06/16/2021   Formatting of this note might be different from the original. Added automatically from request for surgery [id number]   Tobacco use 08/27/2019   UPPER RESPIRATORY INFECTION (URI) 08/09/2007   Qualifier: Diagnosis of   By: Posey Rea MD, Georgina Quint     Replacing diagnoses that were inactivated after the 09/18/22 regulatory import   WEIGHT LOSS 03/04/2007   Qualifier: Diagnosis of  By: Plotnikov MD, Georgina Quint     Past Surgical History:  Procedure Laterality Date  rotator cuff surgery Right     Current Medications: Current Meds  Medication Sig   AIMOVIG 70 MG/ML SOAJ Inject 1 mL into the skin every 30 (thirty) days.   albuterol (PROVENTIL HFA;VENTOLIN HFA) 108 (90 Base) MCG/ACT inhaler Inhale 1-2 puffs into the lungs every 6 (six) hours as needed for wheezing or shortness of breath.   amphetamine-dextroamphetamine (ADDERALL XR) 25 MG 24 hr capsule Take 25 mg by mouth every morning.   busPIRone (BUSPAR) 15 MG tablet Take 7.5-15 mg by mouth 2 (two) times daily.   dicyclomine (BENTYL) 20 MG tablet Take 20 mg by mouth as needed for spasms.   diltiazem (CARDIZEM CD) 120 MG 24 hr capsule Take 1 capsule (120 mg total) by mouth daily. Patient must keep appointment for 09/25/22 further refills. 2 nd attempt   ergocalciferol (VITAMIN D2) 1.25 MG (50000 UT) capsule Take 50,000 Units  by mouth every 30 (thirty) days.   etonogestrel (NEXPLANON) 68 MG IMPL implant Inject 68 mg into the skin once.   lamoTRIgine (LAMICTAL) 150 MG tablet Take 1 tablet (150 mg total) by mouth daily. Please take along with 25 mg daily   lamoTRIgine (LAMICTAL) 25 MG tablet Take 1 tablet (25 mg total) by mouth daily. Take along with 150 mg daily   nitroGLYCERIN (NITROSTAT) 0.4 MG SL tablet Place 1 tablet (0.4 mg total) under the tongue every 5 (five) minutes as needed for chest pain.   omeprazole (PRILOSEC) 40 MG capsule Take 40 mg by mouth daily.   ondansetron (ZOFRAN-ODT) 4 MG disintegrating tablet Take 4 mg by mouth every 8 (eight) hours as needed for vomiting or nausea.   pramipexole (MIRAPEX) 0.5 MG tablet Take 0.5 mg by mouth at bedtime.   pregabalin (LYRICA) 150 MG capsule Take 150 mg by mouth 2 (two) times daily.   promethazine (PHENERGAN) 12.5 MG tablet Take 12.5 mg by mouth as needed for nausea or vomiting.   rizatriptan (MAXALT) 5 MG tablet Take 5 mg by mouth as needed for migraine.   rosuvastatin (CRESTOR) 20 MG tablet Take 1 tablet (20 mg total) by mouth daily.   sertraline (ZOLOFT) 25 MG tablet Take 1 tablet (25 mg total) by mouth daily with breakfast.   sucralfate (CARAFATE) 1 g tablet Take 1 g by mouth 4 (four) times daily.   SUMAtriptan (IMITREX) 100 MG tablet Take 100 mg by mouth as needed for migraine or headache.     Allergies:   Adhesive [tape] and Other   Social History   Socioeconomic History   Marital status: Divorced    Spouse name: Not on file   Number of children: 1   Years of education: Not on file   Highest education level: Some college, no degree  Occupational History   Not on file  Tobacco Use   Smoking status: Every Day   Smokeless tobacco: Never  Vaping Use   Vaping Use: Some days  Substance and Sexual Activity   Alcohol use: Not Currently   Drug use: Not Currently   Sexual activity: Yes  Other Topics Concern   Not on file  Social History Narrative    Not on file   Social Determinants of Health   Financial Resource Strain: Not on file  Food Insecurity: Not on file  Transportation Needs: Not on file  Physical Activity: Not on file  Stress: Not on file  Social Connections: Not on file     Family History: The patient's family history includes Bipolar disorder in her mother and  sister; Drug abuse in her daughter; Stroke in her maternal grandfather, maternal grandmother, and sister.  ROS:   Please see the history of present illness.    All other systems reviewed and are negative.  EKGs/Labs/Other Studies Reviewed:    The following studies were reviewed today: EKG revealed sinus rhythm with left bundle branch block.   Recent Labs: 09/01/2022: ALT 14; BUN 14; Creatinine, Ser 0.98; Platelets 274; TSH 1.110  Recent Lipid Panel    Component Value Date/Time   CHOL 151 09/01/2022 1328   TRIG 67 09/01/2022 1328   HDL 66 09/01/2022 1328   CHOLHDL 6.2 (H) 01/28/2020 1633   CHOLHDL 3.4 CALC 09/24/2006 0916   VLDL 8 09/24/2006 0916   LDLCALC 72 09/01/2022 1328    Physical Exam:    VS:  BP (!) 142/84   Pulse 94   Ht 5' 4.6" (1.641 m)   Wt 159 lb 3.2 oz (72.2 kg)   SpO2 96%   BMI 26.82 kg/m     Wt Readings from Last 3 Encounters:  11/08/22 159 lb 3.2 oz (72.2 kg)  07/22/21 158 lb (71.7 kg)  07/27/20 168 lb 12.8 oz (76.6 kg)     GEN: Patient is in no acute distress HEENT: Normal NECK: No JVD; No carotid bruits LYMPHATICS: No lymphadenopathy CARDIAC: Hear sounds regular, 2/6 systolic murmur at the apex. RESPIRATORY:  Clear to auscultation without rales, wheezing or rhonchi  ABDOMEN: Soft, non-tender, non-distended MUSCULOSKELETAL:  No edema; No deformity  SKIN: Warm and dry NEUROLOGIC:  Alert and oriented x 3 PSYCHIATRIC:  Normal affect   Signed, Garwin Brothers, MD  11/08/2022 1:27 PM    [facility]

## 2022-11-08 NOTE — Addendum Note (Signed)
Addended by: Eleonore Chiquito on: 11/08/2022 03:27 PM   Modules accepted: Orders

## 2022-11-20 ENCOUNTER — Encounter: Payer: Self-pay | Admitting: Cardiology

## 2022-11-21 ENCOUNTER — Other Ambulatory Visit: Payer: Self-pay | Admitting: Psychiatry

## 2022-11-21 DIAGNOSIS — F319 Bipolar disorder, unspecified: Secondary | ICD-10-CM

## 2022-11-23 ENCOUNTER — Telehealth (HOSPITAL_COMMUNITY): Payer: Self-pay | Admitting: *Deleted

## 2022-11-23 NOTE — Telephone Encounter (Signed)
Attempted to call patient regarding upcoming cardiac CT appointment. °Left message on voicemail with name and callback number ° °Lakely Elmendorf RN Navigator Cardiac Imaging °[facility] °[phone] Office °[phone] Cell ° °

## 2022-11-24 ENCOUNTER — Ambulatory Visit (HOSPITAL_COMMUNITY)
Admission: RE | Admit: 2022-11-24 | Discharge: 2022-11-24 | Disposition: A | Discharge status: Home / Self Care | Payer: Medicare PPO | Source: Ambulatory Visit | Attending: Cardiology | Admitting: Cardiology

## 2022-11-24 DIAGNOSIS — R0609 Other forms of dyspnea: Secondary | ICD-10-CM | POA: Diagnosis not present

## 2022-11-24 DIAGNOSIS — R072 Precordial pain: Secondary | ICD-10-CM | POA: Insufficient documentation

## 2022-11-24 MED ORDER — NITROGLYCERIN 0.4 MG SL SUBL
SUBLINGUAL_TABLET | SUBLINGUAL | Status: AC
  Filled 2022-11-24: qty 2

## 2022-11-24 MED ORDER — NITROGLYCERIN 0.4 MG SL SUBL
0.8000 mg | SUBLINGUAL_TABLET | Freq: Once | SUBLINGUAL | Status: AC
  Administered 2022-11-24: 0.8 mg via SUBLINGUAL

## 2022-11-24 MED ORDER — IOHEXOL 350 MG/ML SOLN
95.0000 mL | Freq: Once | INTRAVENOUS | Status: AC | PRN
  Administered 2022-11-24: 95 mL via INTRAVENOUS

## 2022-11-27 ENCOUNTER — Encounter: Payer: Self-pay | Admitting: Cardiology

## 2022-11-27 DIAGNOSIS — R0609 Other forms of dyspnea: Secondary | ICD-10-CM

## 2022-11-27 DIAGNOSIS — Q211 Atrial septal defect, unspecified: Secondary | ICD-10-CM

## 2022-11-30 ENCOUNTER — Telehealth: Payer: Self-pay | Admitting: Family Medicine

## 2022-11-30 NOTE — Telephone Encounter (Signed)
Patient currently declined, but will call back in the future if wanting to follow through with [facility] request.

## 2022-12-01 ENCOUNTER — Ambulatory Visit: Payer: Medicare Other | Admitting: Psychiatry

## 2022-12-01 NOTE — Addendum Note (Signed)
Addended by: Eleonore Chiquito on: 12/01/2022 11:52 AM   Modules accepted: Orders

## 2022-12-05 ENCOUNTER — Other Ambulatory Visit: Payer: Medicare PPO

## 2022-12-05 ENCOUNTER — Ambulatory Visit: Payer: Medicare PPO | Attending: Cardiology

## 2022-12-05 DIAGNOSIS — R0609 Other forms of dyspnea: Secondary | ICD-10-CM | POA: Diagnosis not present

## 2022-12-05 DIAGNOSIS — Q211 Atrial septal defect, unspecified: Secondary | ICD-10-CM | POA: Diagnosis not present

## 2022-12-05 LAB — ECHOCARDIOGRAM COMPLETE BUBBLE STUDY: S' Lateral: 3.2 cm

## 2022-12-06 ENCOUNTER — Ambulatory Visit (HOSPITAL_COMMUNITY): Payer: Medicare Other

## 2022-12-06 ENCOUNTER — Encounter: Payer: Self-pay | Admitting: Cardiology

## 2022-12-06 DIAGNOSIS — Q211 Atrial septal defect, unspecified: Secondary | ICD-10-CM

## 2022-12-10 ENCOUNTER — Other Ambulatory Visit: Payer: Self-pay | Admitting: Cardiology

## 2022-12-10 ENCOUNTER — Other Ambulatory Visit: Payer: Self-pay | Admitting: Psychiatry

## 2022-12-10 DIAGNOSIS — F319 Bipolar disorder, unspecified: Secondary | ICD-10-CM

## 2022-12-12 ENCOUNTER — Other Ambulatory Visit: Payer: Self-pay | Admitting: Cardiology

## 2022-12-13 ENCOUNTER — Other Ambulatory Visit (HOSPITAL_COMMUNITY): Payer: Self-pay

## 2022-12-13 ENCOUNTER — Other Ambulatory Visit: Payer: Self-pay

## 2022-12-13 ENCOUNTER — Encounter: Payer: Self-pay | Admitting: Cardiology

## 2022-12-13 MED ORDER — DILTIAZEM HCL ER COATED BEADS 120 MG PO CP24
120.0000 mg | ORAL_CAPSULE | Freq: Every day | ORAL | 2 refills | Status: DC
  Filled 2022-12-13: qty 90, 90d supply, fill #0
  Filled 2023-02-28: qty 90, 90d supply, fill #1
  Filled 2023-06-28: qty 90, 90d supply, fill #2

## 2022-12-20 ENCOUNTER — Encounter: Payer: Self-pay | Admitting: Cardiology

## 2022-12-22 NOTE — Addendum Note (Signed)
Addended by: Eleonore Chiquito on: 12/22/2022 02:52 PM   Modules accepted: Orders

## 2022-12-23 LAB — HEMOGLOBIN AND HEMATOCRIT, BLOOD
Hematocrit: 37.8 % (ref 34.0–46.6)
Hemoglobin: 12.8 g/dL (ref 11.1–15.9)

## 2022-12-25 ENCOUNTER — Encounter: Payer: Self-pay | Admitting: Cardiology

## 2023-01-01 ENCOUNTER — Other Ambulatory Visit: Payer: Self-pay | Admitting: Psychiatry

## 2023-01-01 DIAGNOSIS — F4310 Post-traumatic stress disorder, unspecified: Secondary | ICD-10-CM

## 2023-01-01 DIAGNOSIS — F411 Generalized anxiety disorder: Secondary | ICD-10-CM

## 2023-01-04 ENCOUNTER — Other Ambulatory Visit: Payer: Self-pay | Admitting: Psychiatry

## 2023-01-04 DIAGNOSIS — F319 Bipolar disorder, unspecified: Secondary | ICD-10-CM

## 2023-01-06 ENCOUNTER — Other Ambulatory Visit: Payer: Self-pay | Admitting: Psychiatry

## 2023-01-06 DIAGNOSIS — F319 Bipolar disorder, unspecified: Secondary | ICD-10-CM

## 2023-01-08 ENCOUNTER — Telehealth: Payer: Self-pay | Admitting: Psychiatry

## 2023-01-08 NOTE — Telephone Encounter (Signed)
Thank you :)

## 2023-01-08 NOTE — Telephone Encounter (Signed)
Patient emailed office work email requesting a virtual appointment. She states, "I apologize but on the portal, it has a Printmaker, Inetta Fermo, emailed back to let her know this has to be an in person appointment and to call the office to schedule.   I do see a message in the portal from 01/06/23 requesting virtual.

## 2023-01-08 NOTE — Telephone Encounter (Signed)
'  I would like to request a virtual appointment please. My oldest sister recently passed away & my dad in ICU. Please advise when possible.'  I received the above message from this patient.  I do not think I have any appointments available in July to give this patient.  Next available appointment will be in late August.  Since patient is currently not stable on medications I will recommend it be done in person.  However if we do have any no shows or cancellations in the next couple of weeks I will have staff contact this patient to do a virtual visit as needed.

## 2023-02-03 ENCOUNTER — Other Ambulatory Visit: Payer: Self-pay | Admitting: Psychiatry

## 2023-02-03 DIAGNOSIS — F411 Generalized anxiety disorder: Secondary | ICD-10-CM

## 2023-02-03 DIAGNOSIS — F4310 Post-traumatic stress disorder, unspecified: Secondary | ICD-10-CM

## 2023-02-06 ENCOUNTER — Other Ambulatory Visit: Payer: Self-pay | Admitting: Psychiatry

## 2023-02-06 DIAGNOSIS — F319 Bipolar disorder, unspecified: Secondary | ICD-10-CM

## 2023-02-06 DIAGNOSIS — F411 Generalized anxiety disorder: Secondary | ICD-10-CM

## 2023-02-06 DIAGNOSIS — F4310 Post-traumatic stress disorder, unspecified: Secondary | ICD-10-CM

## 2023-02-07 ENCOUNTER — Telehealth: Payer: Self-pay | Admitting: Psychiatry

## 2023-02-07 NOTE — Telephone Encounter (Signed)
Please contact patient to schedule an in office visit.

## 2023-02-07 NOTE — Telephone Encounter (Signed)
Unable to reach patient multiple times. Message left to call and schedule an in person appointment. I will schedule her first available and she will see in mychart if I don't hear back from her, since she is messaging that she needs an appointment.

## 2023-02-07 NOTE — Telephone Encounter (Signed)
Thank you for taking care of this.

## 2023-02-20 ENCOUNTER — Encounter (HOSPITAL_COMMUNITY): Payer: Self-pay

## 2023-02-20 ENCOUNTER — Encounter: Payer: Self-pay | Admitting: Cardiology

## 2023-02-21 ENCOUNTER — Other Ambulatory Visit: Payer: Self-pay | Admitting: Cardiology

## 2023-02-21 ENCOUNTER — Ambulatory Visit (HOSPITAL_COMMUNITY)
Admission: RE | Admit: 2023-02-21 | Discharge: 2023-02-21 | Disposition: A | Discharge status: Home / Self Care | Payer: Medicare PPO | Source: Ambulatory Visit | Attending: Cardiology | Admitting: Cardiology

## 2023-02-21 DIAGNOSIS — Q211 Atrial septal defect, unspecified: Secondary | ICD-10-CM

## 2023-02-21 MED ORDER — GADOBUTROL 1 MMOL/ML IV SOLN
10.0000 mL | Freq: Once | INTRAVENOUS | Status: AC | PRN
  Administered 2023-02-21: 10 mL via INTRAVENOUS

## 2023-02-22 ENCOUNTER — Encounter: Payer: Self-pay | Admitting: Cardiology

## 2023-02-28 ENCOUNTER — Encounter: Payer: Self-pay | Admitting: Cardiology

## 2023-02-28 DIAGNOSIS — E782 Mixed hyperlipidemia: Secondary | ICD-10-CM

## 2023-02-28 DIAGNOSIS — R5383 Other fatigue: Secondary | ICD-10-CM

## 2023-02-28 DIAGNOSIS — R002 Palpitations: Secondary | ICD-10-CM

## 2023-02-28 MED ORDER — ROSUVASTATIN CALCIUM 20 MG PO TABS
20.0000 mg | ORAL_TABLET | Freq: Every day | ORAL | 3 refills | Status: AC
Start: 2023-02-28 — End: ?

## 2023-04-14 ENCOUNTER — Other Ambulatory Visit: Payer: Self-pay | Admitting: Psychiatry

## 2023-04-14 DIAGNOSIS — F411 Generalized anxiety disorder: Secondary | ICD-10-CM

## 2023-04-14 DIAGNOSIS — F4310 Post-traumatic stress disorder, unspecified: Secondary | ICD-10-CM

## 2023-05-08 ENCOUNTER — Encounter: Payer: Self-pay | Admitting: Cardiology

## 2023-05-15 ENCOUNTER — Ambulatory Visit: Payer: Medicare PPO | Admitting: Cardiology

## 2023-05-24 ENCOUNTER — Ambulatory Visit: Payer: Medicare PPO | Admitting: Cardiology

## 2023-07-08 NOTE — Progress Notes (Unsigned)
Cardiology Office Note:  .   Date:  07/09/2023  ID:  Judith Bass, DOB [birth year], MRN [id number] PCP: Montez Hageman, DO  [facility] Providers Cardiologist:  Garwin Brothers, MD    History of Present Illness: Judith Bass is a 58 y.o. female with a past medical history of hypertension, migraines, asthma, GERD, IBS, dyslipidemia, palpitations, fibromyalgia, prior tobacco abuse.  02/21/2023 cardiac MRI mild MR, trileaflet aortic valve, no LGE, no definite PFO or ASD 12/05/2022 echo EF 60-65%, no atrial level shunt detected 11/24/2022 coronary CTA calcium score of 0, interatrial septum notable for left-to-right contrast communication  Most recently she was evaluated by Dr. Tomie China on 11/08/2022, she was bothered by DOE primarily.  Due to her comorbid conditions a coronary CTA was arranged revealing a calcium score of 0 however, interatrial septum to the with left-to-right communication was noted.  A bubble study was then arranged which was suboptimal > recommendations for TEE versus> cMRI was suggested.  She underwent cardiac MRI on 02/21/2023, no definitive PFO or ASD was identified.  She presents today for follow-up.  She has been doing well from a cardiac perspective, offers no formal complaints.  She has recently gained custody of her young grandchildren so she is busy raising them.  She is trying to exercise more, sometimes bothered by DOE however she thinks is component of deconditioning. She denies chest pain, palpitations, dyspnea, pnd, orthopnea, n, v, dizziness, syncope, edema, weight gain, or early satiety.   ROS: Review of Systems  All other systems reviewed and are negative.    Studies Reviewed: .        Cardiac Studies & Procedures     STRESS TESTS  MYOCARDIAL PERFUSION IMAGING 11/14/2018  Narrative  Nuclear stress EF: 64%.  The left ventricular ejection fraction is normal (55-65%).  There was no ST segment deviation noted during stress.  The  study is normal.  This is a low risk study.  Breast attenuation noted in anteroseptal segments.  ECHOCARDIOGRAM  ECHOCARDIOGRAM COMPLETE BUBBLE STUDY 12/05/2022  Narrative ECHOCARDIOGRAM REPORT    Patient Name:   Judith Bass [facility] Date of Exam: 12/05/2022 Medical Rec #:  [id number]         Height:       64.6 in Accession #:    [id number]        Weight:       159.2 lb Date of Birth:  August 23, 1965        BSA:          1.787 m Patient Age:    56 years          BP:           143/77 mmHg Patient Gender: F                 HR:           79 bpm. Exam Location:  [place]  Procedure: 2D Echo, Cardiac Doppler, Color Doppler, Strain Analysis and Saline Contrast Bubble Study  Indications:    DOE (dyspnea on exertion) [R06.09 (ICD-10-CM)]; Atrial septal defect [Q21.10 (ICD-10-CM)]  History:        Patient has no prior history of Echocardiogram examinations. Signs/Symptoms:Chest Pain; Risk Factors:Hypertension and Dyslipidemia.  Sonographer:    Louie Boston RDCS Referring Phys: Rito Ehrlich [facility]  IMPRESSIONS   1. Left ventricular ejection fraction, by estimation, is 60 to 65%. The left ventricle has normal function. The left ventricle has no regional  wall motion abnormalities. Left ventricular diastolic parameters were normal. GLS-14.1%. 2. Suboptimal bubble study. Consider TEE/cardiac MRI if clinically felt necessary. S  FINDINGS Left Ventricle: Left ventricular ejection fraction, by estimation, is 60 to 65%. The left ventricle has normal function. The left ventricle has no regional wall motion abnormalities. The left ventricular internal cavity size was normal in size. There is no left ventricular hypertrophy. Left ventricular diastolic parameters were normal.  Right Ventricle: The right ventricular size is normal. No increase in right ventricular wall thickness. Right ventricular systolic function is normal. There is normal pulmonary artery systolic pressure. The tricuspid regurgitant  velocity is 1.67 m/s, and with an assumed right atrial pressure of 3 mmHg, the estimated right ventricular systolic pressure is 14.2 mmHg.  Left Atrium: Left atrial size was normal in size.  Right Atrium: Right atrial size was normal in size.  Pericardium: There is no evidence of pericardial effusion.  Mitral Valve: The mitral valve is normal in structure. No evidence of mitral valve regurgitation. No evidence of mitral valve stenosis.  Tricuspid Valve: The tricuspid valve is normal in structure. Tricuspid valve regurgitation is not demonstrated. No evidence of tricuspid stenosis.  Aortic Valve: The aortic valve is normal in structure. Aortic valve regurgitation is not visualized. No aortic stenosis is present.  Pulmonic Valve: The pulmonic valve was normal in structure. Pulmonic valve regurgitation is not visualized. No evidence of pulmonic stenosis.  Aorta: The aortic root is normal in size and structure.  Venous: The inferior vena cava is normal in size with greater than 50% respiratory variability, suggesting right atrial pressure of 3 mmHg.  IAS/Shunts: No atrial level shunt detected by color flow Doppler. Agitated saline contrast was given intravenously to evaluate for intracardiac shunting.   LEFT VENTRICLE PLAX 2D LVIDd:         4.40 cm   Diastology LVIDs:         3.20 cm   LV e' medial:    5.98 cm/s LV PW:         1.10 cm   LV E/e' medial:  11.6 LV IVS:        1.20 cm   LV e' lateral:   9.79 cm/s LVOT diam:     2.10 cm   LV E/e' lateral: 7.1 LV SV:         58 LV SV Index:   33 LVOT Area:     3.46 cm   RIGHT VENTRICLE             IVC RV Basal diam:  2.50 cm     IVC diam: 1.90 cm RV S prime:     16.00 cm/s TAPSE (M-mode): 2.3 cm  LEFT ATRIUM             Index        RIGHT ATRIUM           Index LA diam:        2.70 cm 1.51 cm/m   RA Area:     12.60 cm LA Vol (A2C):   30.1 ml 16.84 ml/m  RA Volume:   31.00 ml  17.34 ml/m LA Vol (A4C):   32.5 ml 18.18 ml/m LA  Biplane Vol: 32.5 ml 18.18 ml/m AORTIC VALVE LVOT Vmax:   87.40 cm/s LVOT Vmean:  58.300 cm/s LVOT VTI:    0.168 m  AORTA Ao Root diam: 3.00 cm Ao Asc diam:  3.00 cm Ao Desc diam: 2.20 cm  MV E velocity: 69.10  cm/s  TRICUSPID VALVE MV A velocity: 72.10 cm/s  TR Peak grad:   11.2 mmHg MV E/A ratio:  0.96        TR Vmax:        167.00 cm/s  SHUNTS Systemic VTI:  0.17 m Systemic Diam: 2.10 cm  Belva Crome MD Electronically signed by Belva Crome MD Signature Date/Time: 12/05/2022/5:31:35 PM    Final    CT SCANS  CT CORONARY MORPH W/CTA COR W/SCORE 11/24/2022  Addendum 12/04/2022  1:07 PM ADDENDUM REPORT: 12/04/2022 13:05  EXAM: OVER-READ INTERPRETATION  CT CHEST  The following report is an over-read performed by radiologist Dr. Alver Fisher [facility] on 12/04/2022. This over-read does not include interpretation of cardiac or coronary anatomy or pathology. The interpretation by the cardiologist is attached.  COMPARISON:  None.  FINDINGS: Scout view is unremarkable.  No unexpected extracardiac findings.  IMPRESSION: No extracardiac findings requiring follow-up.   Electronically Signed By: Leanna Battles M.D. On: 12/04/2022 13:05  Narrative CLINICAL DATA:  58 Year-old Female  EXAM: Cardiac/Coronary  CTA  TECHNIQUE: The patient was scanned on a [employer].  FINDINGS: Scan was triggered in the descending thoracic aorta. Axial non-contrast 3 mm slices were carried out through the heart. The data set was analyzed on a dedicated work station and scored using the Agatson method. Gantry rotation speed was 250 msecs and collimation was .6 mm. 0.8 mg of sl NTG was given. The 3D data set was reconstructed in 5% intervals of the 67-82 % of the R-R cycle. Diastolic phases were analyzed on a dedicated work station using MPR, MIP and VRT modes. The patient received 95 cc of contrast.  Coronary Arteries:  Normal coronary origin.   Right dominance.  Coronary Calcium Score:  Left main: 0  Left anterior descending artery: 0  Left circumflex artery: 0  Right coronary artery: 0  Total: 0  Percentile: 1st for age, sex, and race matched control.  Plaque Analysis: None  RCA is a large dominant artery that gives rise to PDA and PLA. There is no significant plaque.  Left main is a large artery that gives rise to LAD and LCX arteries. There is no significant plaque.  LAD is a large vessel that gives rise to one large D1 Branch. There is no significant plaque.  LCX is a non-dominant artery that gives rise to one large OM1 branch. There is no significant plaque.  Other findings:  Aorta: Normal size.  No calcifications.  No dissection.  Main Pulmonary Artery: Normal size of the pulmonary artery.  Systemic Veins: Normal drainage  Aortic Valve:  Tri-leaflet.  No calcifications.  Mitral valve: No calcifications.  Normal pulmonary vein drainage into the left atrium.  Normal left atrial appendage without a thrombus.  Interatrial septum notable for left to right contrast communication.  Left Ventricle: Normal size.  Prominent trabeculation  Left Atrium: Normal size  Right Ventricle: Mild dilation  Right Atrium: Normal size  Pericardium: Normal thickness  Extra-cardiac findings: See attached radiology report for non-cardiac structures.  Artifact: Stair step artifact  IMPRESSION: 1. Coronary calcium score of 0. This was 1st percentile for age, sex, and race matched control.  2. Normal coronary origin with right dominance.  3. CAD-RADS 0. No evidence of CAD (0%). Consider non-atherosclerotic causes of chest pain.  4. Considered transthoracic echocardiogram with bubble study in follow up if clinically indicated.  RECOMMENDATIONS: RECOMMENDATIONS The proposed cut-off value of 1,651 AU yielded a 93 % sensitivity and  75 % specificity in grading AS severity in patients with classical  low-flow, low-gradient AS. Proposed different cut-off values to define severe AS for men and women as 2,065 AU and 1,274 AU, respectively. The joint European and American recommendations for the assessment of AS consider the aortic valve calcium score as a continuum - a very high calcium score suggests severe AS and a low calcium score suggests severe AS is unlikely.  Sunday Shams, et al. 2017 ESC/EACTS Guidelines for the management of valvular heart disease. Eur Heart J 2017;38:2739-91.  Coronary artery calcium (CAC) score is a strong predictor of incident coronary heart disease (CHD) and provides predictive information beyond traditional risk factors. CAC scoring is reasonable to use in the decision to withhold, postpone, or initiate statin therapy in intermediate-risk or selected borderline-risk asymptomatic adults (age 107-75 years and LDL-C >=70 to <190 mg/dL) who do not have diabetes or established atherosclerotic cardiovascular disease (ASCVD).* In intermediate-risk (10-year ASCVD risk >=7.5% to <20%) adults or selected borderline-risk (10-year ASCVD risk >=5% to <7.5%) adults in whom a CAC score is measured for the purpose of making a treatment decision the following recommendations have been made:  If CAC = 0, it is reasonable to withhold statin therapy and reassess in 5 to 10 years, as long as higher risk conditions are absent (diabetes mellitus, family history of premature CHD in first degree relatives (males <55 years; females <65 years), cigarette smoking, LDL >=190 mg/dL or other independent risk factors).  If CAC is 1 to 99, it is reasonable to initiate statin therapy for patients >=40 years of age.  If CAC is >=100 or >=75th percentile, it is reasonable to initiate statin therapy at any age.  Cardiology referral should be considered for patients with CAC scores =400 or >=75th percentile.  *2018  AHA/ACC/AACVPR/AAPA/ABC/ACPM/ADA/AGS/APhA/ASPC/NLA/PCNA Guideline on the Management of Blood Cholesterol: A Report of the American College of Cardiology/American Heart Association Task Force on Clinical Practice Guidelines. J Am Coll Cardiol. 2019;73(24):3168-3209.  Riley Lam, MD  Electronically Signed: By: Riley Lam M.D. On: 11/24/2022 15:54  CARDIAC MRI  MR CARDIAC MORPHOLOGY W WO CONTRAST 02/21/2023  Narrative CLINICAL DATA:  Abnormal echocardiogram, possible PFO on coronary CTA.  EXAM: CARDIAC MRI  TECHNIQUE: The patient was scanned on a 1.5 Tesla GE magnet. A dedicated cardiac coil was used. Functional imaging was done using Fiesta sequences. 2,3, and 4 chamber views were done to assess for RWMA's. Modified Simpson's rule using a short axis stack was used to calculate an ejection fraction on a dedicated work [employer]. The patient received 8 cc of Gadavist. After 10 minutes inversion recovery sequences were used to assess for infiltration and scar tissue.  FINDINGS: Limited images of the lung fields showed no gross abnormalities.  Normal left ventricular size and wall thickness. Septal-lateral dyssynchrony suggestive of LBBB with LV EF 53%. Normal right ventricular size and systolic function, RV EF 51%. Normal right and left atrial sizes. Trileaflet aortic valve, no significant stenosis or regurgitation. Mild mitral regurgitation, regurgitant fraction 11%.  On delayed enhancement imaging, there was not definite late gadolinium enhancement (LGE).  MEASUREMENTS: MEASUREMENTS LVEDV 130 mL  LVEDVi 72 mL/m2 LVSV 69 mL LVEF 53%  RVEDV 77 mL  RVEDVi 43 mL/m2 RVSV 40 mL RVEF 51%  Aortic forward volume 61 mL  Aortic regurgitant fraction 2%  T1 1068, ECV 28%  IMPRESSION: 1. Normal LV size with septal-lateral dyssynchrony suggestive of LBBB, EF 53%.  2.  Normal  RV size and systolic function, EF 51%.  3. No  definite myocardial LGE, so no definitive evidence for prior MI, infiltrative disease, or myocarditis.  4.  Normal extracellular volume percentage.  5. No definite PFO or ASD identified, however cardiac MRI generally does not have the temporal resolution to identify a PFO. If there is concern for PFO, best test would be echo with bubble.  Dalton Mclean   Electronically Signed By: Marca Ancona M.D. On: 02/21/2023 17:04         Risk Assessment/Calculations:             Physical Exam:   VS:  BP 132/78   Pulse 82   Ht 5' 4.6" (1.641 m)   Wt 170 lb (77.1 kg)   SpO2 97%   BMI 28.64 kg/m    Wt Readings from Last 3 Encounters:  07/09/23 170 lb (77.1 kg)  11/08/22 159 lb 3.2 oz (72.2 kg)  07/22/21 158 lb (71.7 kg)    GEN: Well nourished, well developed in no acute distress NECK: No JVD; No carotid bruits CARDIAC: RRR, no murmurs, rubs, gallops RESPIRATORY:  Clear to auscultation without rales, wheezing or rhonchi  ABDOMEN: Soft, non-tender, non-distended EXTREMITIES:  No edema; No deformity   ASSESSMENT AND PLAN: .   Interatrial shunt-this was noted on coronary CTA however bubble study was negative >> a cardiac MRI was arranged which was not revealing.    DOE-she is trying to be more physically active, has some shortness of breath at times, but she does feel it is related to deconditioning.  Encouraged her to be physically active most days of the week, if after a few months of this she continues to feel this way to reach out to [place] and we can decide if further testing needs to be completed.  Hypertension-blood pressure controlled 132/78, continue Cardizem 120 mg daily.  Dyslipidemia-this is formally monitored by PCP, currently on Crestor 20 mg daily.  Prior tobacco abuse-complete cessation for several months, congratulated her on this accomplishment.       Dispo: Follow-up in 1 year.  Signed, Flossie Dibble, NP

## 2023-07-09 ENCOUNTER — Encounter: Payer: Self-pay | Admitting: *Deleted

## 2023-07-09 ENCOUNTER — Encounter: Payer: Self-pay | Admitting: Cardiology

## 2023-07-09 ENCOUNTER — Ambulatory Visit: Payer: Medicare PPO | Attending: Cardiology | Admitting: Cardiology

## 2023-07-09 VITALS — BP 132/78 | HR 82 | Ht 64.6 in | Wt 170.0 lb

## 2023-07-09 DIAGNOSIS — G4733 Obstructive sleep apnea (adult) (pediatric): Secondary | ICD-10-CM

## 2023-07-09 DIAGNOSIS — I10 Essential (primary) hypertension: Secondary | ICD-10-CM | POA: Diagnosis not present

## 2023-07-09 DIAGNOSIS — Q211 Atrial septal defect, unspecified: Secondary | ICD-10-CM

## 2023-07-09 DIAGNOSIS — R0609 Other forms of dyspnea: Secondary | ICD-10-CM | POA: Diagnosis not present

## 2023-07-09 NOTE — Patient Instructions (Signed)
Medication Instructions:  Your physician recommends that you continue on your current medications as directed. Please refer to the Current Medication list given to you today.  *If you need a refill on your cardiac medications before your next appointment, please call your pharmacy*   Lab Work: NONE If you have labs (blood work) drawn today and your tests are completely normal, you will receive your results only by: MyChart Message (if you have MyChart) OR A paper copy in the mail If you have any lab test that is abnormal or we need to change your treatment, we will call you to review the results.   Testing/Procedures: NONE   Follow-Up: At [facility], you and your health needs are our priority.  As part of our continuing mission to provide you with exceptional heart care, we have created designated Provider Care Teams.  These Care Teams include your primary Cardiologist (physician) and Advanced Practice Providers (APPs -  Physician Assistants and Nurse Practitioners) who all work together to provide you with the care you need, when you need it.  We recommend signing up for the patient portal called "MyChart".  Sign up information is provided on this After Visit Summary.  MyChart is used to connect with patients for Virtual Visits (Telemedicine).  Patients are able to view lab/test results, encounter notes, upcoming appointments, etc.  Non-urgent messages can be sent to your provider as well.   To learn more about what you can do with MyChart, go to [email or web address].    Your next appointment:   1 year(s)  Provider:   Rajan Revankar, MD    Other Instructions   

## 2023-10-12 ENCOUNTER — Other Ambulatory Visit: Payer: Self-pay | Admitting: Cardiology

## 2023-10-15 ENCOUNTER — Other Ambulatory Visit (HOSPITAL_COMMUNITY): Payer: Self-pay

## 2023-10-15 MED ORDER — DILTIAZEM HCL ER COATED BEADS 120 MG PO CP24
120.0000 mg | ORAL_CAPSULE | Freq: Every day | ORAL | 2 refills | Status: DC
  Filled 2023-10-15: qty 90, 90d supply, fill #0
  Filled 2023-12-03: qty 90, 90d supply, fill #1

## 2023-12-03 ENCOUNTER — Encounter: Payer: Self-pay | Admitting: Cardiology

## 2023-12-03 DIAGNOSIS — R002 Palpitations: Secondary | ICD-10-CM

## 2023-12-03 DIAGNOSIS — E782 Mixed hyperlipidemia: Secondary | ICD-10-CM

## 2023-12-03 DIAGNOSIS — R5383 Other fatigue: Secondary | ICD-10-CM

## 2023-12-04 ENCOUNTER — Other Ambulatory Visit: Payer: Self-pay

## 2023-12-04 MED ORDER — DILTIAZEM HCL ER COATED BEADS 120 MG PO CP24: 120.0000 mg | ORAL_CAPSULE | Freq: Every day | ORAL | 2 refills | Status: AC

## 2023-12-04 MED ORDER — NITROGLYCERIN 0.4 MG SL SUBL: 0.4000 mg | SUBLINGUAL_TABLET | SUBLINGUAL | 6 refills | Status: AC | PRN

## 2023-12-04 MED ORDER — ROSUVASTATIN CALCIUM 20 MG PO TABS
20.0000 mg | ORAL_TABLET | Freq: Every day | ORAL | 2 refills | Status: AC
Start: 2023-12-04 — End: ?

## 2024-01-10 LAB — COLOGUARD: COLOGUARD: NEGATIVE

## 2024-05-31 ENCOUNTER — Encounter: Payer: Self-pay | Admitting: Cardiology

## 2024-06-01 ENCOUNTER — Encounter: Payer: Self-pay | Admitting: Cardiology

## 2024-06-05 ENCOUNTER — Ambulatory Visit: Admitting: Cardiology

## 2024-07-14 ENCOUNTER — Encounter: Payer: Self-pay | Admitting: Cardiology

## 2024-07-23 ENCOUNTER — Ambulatory Visit: Admitting: Cardiology

## 2024-07-29 ENCOUNTER — Ambulatory Visit: Admitting: Cardiology
